# Patient Record
Sex: Female | Born: 1940 | Race: Black or African American | Hispanic: No | Marital: Single | Smoking: Never smoker
Health system: Southern US, Community
[De-identification: ages and names within clinical notes are randomized; demographics above are authoritative.]

## PROBLEM LIST (undated history)

## (undated) DIAGNOSIS — I4891 Unspecified atrial fibrillation: Secondary | ICD-10-CM

## (undated) DIAGNOSIS — F419 Anxiety disorder, unspecified: Secondary | ICD-10-CM

## (undated) DIAGNOSIS — I519 Heart disease, unspecified: Secondary | ICD-10-CM

## (undated) DIAGNOSIS — R42 Dizziness and giddiness: Secondary | ICD-10-CM

## (undated) DIAGNOSIS — E7800 Pure hypercholesterolemia, unspecified: Secondary | ICD-10-CM

## (undated) HISTORY — DX: Heart disease, unspecified: I51.9

## (undated) HISTORY — PX: BREAST SURGERY: SHX581

## (undated) HISTORY — DX: Anxiety disorder, unspecified: F41.9

## (undated) HISTORY — PX: TUBAL LIGATION: SHX77

## (undated) HISTORY — DX: Unspecified atrial fibrillation: I48.91

---

## 2013-06-29 ENCOUNTER — Emergency Department (HOSPITAL_COMMUNITY)
Admission: EM | Admit: 2013-06-29 | Discharge: 2013-06-29 | Disposition: A | Discharge status: Home / Self Care | Payer: Medicare Other | Attending: Emergency Medicine | Admitting: Emergency Medicine

## 2013-06-29 ENCOUNTER — Encounter (HOSPITAL_COMMUNITY): Payer: Self-pay | Admitting: Emergency Medicine

## 2013-06-29 DIAGNOSIS — R42 Dizziness and giddiness: Secondary | ICD-10-CM | POA: Insufficient documentation

## 2013-06-29 DIAGNOSIS — Z8639 Personal history of other endocrine, nutritional and metabolic disease: Secondary | ICD-10-CM | POA: Insufficient documentation

## 2013-06-29 DIAGNOSIS — Z862 Personal history of diseases of the blood and blood-forming organs and certain disorders involving the immune mechanism: Secondary | ICD-10-CM | POA: Insufficient documentation

## 2013-06-29 HISTORY — DX: Dizziness and giddiness: R42

## 2013-06-29 HISTORY — DX: Pure hypercholesterolemia, unspecified: E78.00

## 2013-06-29 LAB — BASIC METABOLIC PANEL
BUN: 16 mg/dL (ref 6–23)
CO2: 27 mEq/L (ref 19–32)
Calcium: 9.3 mg/dL (ref 8.4–10.5)
Chloride: 101 mEq/L (ref 96–112)
Creatinine, Ser: 0.67 mg/dL (ref 0.50–1.10)
GFR calc Af Amer: >90 mL/min (ref >90)
GFR, EST NON AFRICAN AMERICAN: 84 mL/min — AB (ref >90)
GLUCOSE: 120 mg/dL — AB (ref 70–99)
POTASSIUM: 4.1 meq/L (ref 3.7–5.3)
SODIUM: 138 meq/L (ref 137–147)

## 2013-06-29 LAB — CBC WITH DIFFERENTIAL/PLATELET
Basophils Absolute: 0 10*3/uL (ref 0.0–0.1)
Basophils Relative: 0 % (ref 0–1)
EOS ABS: 0 10*3/uL (ref 0.0–0.7)
Eosinophils Relative: 0 % (ref 0–5)
HCT: 38 % (ref 36.0–46.0)
Hemoglobin: 13 g/dL (ref 12.0–15.0)
Lymphocytes Relative: 9 % — ABNORMAL LOW (ref 12–46)
Lymphs Abs: 1 10*3/uL (ref 0.7–4.0)
MCH: 31.7 pg (ref 26.0–34.0)
MCHC: 34.2 g/dL (ref 30.0–36.0)
MCV: 92.7 fL (ref 78.0–100.0)
Monocytes Absolute: 0.4 10*3/uL (ref 0.1–1.0)
Monocytes Relative: 3 % (ref 3–12)
Neutro Abs: 10 10*3/uL — ABNORMAL HIGH (ref 1.7–7.7)
Neutrophils Relative %: 88 % — ABNORMAL HIGH (ref 43–77)
Platelets: 186 10*3/uL (ref 150–400)
RBC: 4.1 MIL/uL (ref 3.87–5.11)
RDW: 13.2 % (ref 11.5–15.5)
WBC: 11.4 10*3/uL — ABNORMAL HIGH (ref 4.0–10.5)

## 2013-06-29 LAB — TROPONIN I: Troponin I: <0.3 ng/mL (ref <0.30)

## 2013-06-29 MED ORDER — MECLIZINE HCL 25 MG PO TABS
25.0000 mg | ORAL_TABLET | Freq: Three times a day (TID) | ORAL | Status: AC | PRN
Start: 2013-06-29 — End: ?

## 2013-06-29 MED ORDER — ONDANSETRON HCL 4 MG/2ML IJ SOLN
INTRAMUSCULAR | Status: AC
  Filled 2013-06-29: qty 2

## 2013-06-29 MED ORDER — ONDANSETRON HCL 4 MG/2ML IJ SOLN
4.0000 mg | Freq: Once | INTRAMUSCULAR | Status: AC
  Administered 2013-06-29: 4 mg via INTRAVENOUS

## 2013-06-29 MED ORDER — MECLIZINE HCL 12.5 MG PO TABS
25.0000 mg | ORAL_TABLET | Freq: Once | ORAL | Status: AC
Start: 2013-06-29 — End: 2013-06-29
  Administered 2013-06-29: 25 mg via ORAL
  Filled 2013-06-29: qty 2

## 2013-06-29 NOTE — ED Notes (Signed)
Reports woke up this morning with severe vertigo with room spinning.  Took OTC motion sickness relief and started vomiting and has been unable to keep meds down.  Denies pain.  C/o numbness and tingling in bil hands and feet.  EMS gave zofran 4mg  IV in route with no relief.

## 2013-06-29 NOTE — Discharge Instructions (Signed)
Benign Positional Vertigo Vertigo means you feel like you or your surroundings are moving when they are not. Benign positional vertigo is the most common form of vertigo. Benign means that the cause of your condition is not serious. Benign positional vertigo is more common in older adults. CAUSES  Benign positional vertigo is the result of an upset in the labyrinth system. This is an area in the middle ear that helps control your balance. This may be caused by a viral infection, head injury, or repetitive motion. However, often no specific cause is found. SYMPTOMS  Symptoms of benign positional vertigo occur when you move your head or eyes in different directions. Some of the symptoms may include:  Loss of balance and falls.  Vomiting.  Blurred vision.  Dizziness.  Nausea.  Involuntary eye movements (nystagmus). DIAGNOSIS  Benign positional vertigo is usually diagnosed by physical exam. If the specific cause of your benign positional vertigo is unknown, your caregiver may perform imaging tests, such as magnetic resonance imaging (MRI) or computed tomography (CT). TREATMENT  Your caregiver may recommend movements or procedures to correct the benign positional vertigo. Medicines such as meclizine, benzodiazepines, and medicines for nausea may be used to treat your symptoms. In rare cases, if your symptoms are caused by certain conditions that affect the inner ear, you may need surgery. HOME CARE INSTRUCTIONS   Follow your caregiver's instructions.  Move slowly. Do not make sudden body or head movements.  Avoid driving.  Avoid operating heavy machinery.  Avoid performing any tasks that would be dangerous to you or others during a vertigo episode.  Drink enough fluids to keep your urine clear or pale yellow. SEEK IMMEDIATE MEDICAL CARE IF:   You develop problems with walking, weakness, numbness, or using your arms, hands, or legs.  You have difficulty speaking.  You develop  severe headaches.  Your nausea or vomiting continues or gets worse.  You develop visual changes.  Your family or friends notice any behavioral changes.  Your condition gets worse.  You have a fever.  You develop a stiff neck or sensitivity to light. MAKE SURE YOU:   Understand these instructions.  Will watch your condition.  Will get help right away if you are not doing well or get worse. Document Released: 11/29/2005 Document Revised: 05/16/2011 Document Reviewed: 11/11/2010 [facility] Patient Information 2014 [place], [place].   Rest. Prescription for vertigo.   Followup your Dr.

## 2013-06-29 NOTE — ED Provider Notes (Signed)
CSN: [id number]     Arrival date & time 06/29/13  1025 History  This chart was scribed for Donnetta HutchingBrian Tayjon Halladay, MD by Quintella ReichertMatthew Underwood, ED scribe.  This patient was seen in room APA01/APA01 and the patient's care was started at 11:56 AM.   Chief Complaint  Patient presents with  . Dizziness    The history is provided by the patient. No language interpreter was used.    HPI Comments: Michelle Ayala is a 73 y.o. female with h/o vertigo and high cholesterol who presents to the Emergency Department complaining of dizziness that began this morning.  Pt states she awoke at 8:30 AM and when she opened her eyes "the room started spinning."  She got to her kitchen and took one of her "motion pills" prescribed for previous vertigo.  She lay back in bed but continued to feel dizzy.  She became nauseated and went to the restroom and vomited.  She was unable to get back up to her bed so she called her daughter.  She states she also had some numbness and tingling in her fingertips.  She denies associated CP or SOB.  When daughter arrived she took another motion pill and had some Ginger Ale but vomited this up too, so she called EMS.  She received a Zofran injection after arrival to the ED and currently pt states she feels much better in spite of not being able to tolerate any vertigo medications.  She states the tingling in fingertips has since resolved.  Pt denies personal h/o DM, HTN or MI.  She admits to family h/o HTN and MI.   Past Medical History  Diagnosis Date  . Vertigo   . High cholesterol     Past Surgical History  Procedure Laterality Date  . Cesarean section    . Tubal ligation    . Breast surgery      cyst removal   No family history on file.   History  Substance Use Topics  . Smoking status: Never Smoker   . Smokeless tobacco: Not on file  . Alcohol Use: Yes     Comment: occ    OB History   Grav Para Term Preterm Abortions TAB SAB Ect Mult Living                   Review of  Systems A complete 10 system review of systems was obtained and all systems are negative except as noted in the HPI and PMH.     Allergies  Review of patient's allergies indicates no known allergies.  Home Medications   Prior to Admission medications   Not on File   BP 134/70  Pulse 83  Temp(Src) 97.6 F (36.4 C) (Oral)  Resp 24  Ht 5\' 3"  (1.6 m)  Wt 186 lb (84.369 kg)  BMI 32.96 kg/m2  SpO2 100%  Physical Exam  Nursing note and vitals reviewed. Constitutional: She is oriented to person, place, and time. She appears well-developed and well-nourished.  HENT:  Head: Normocephalic and atraumatic.  Eyes: Conjunctivae and EOM are normal. Pupils are equal, round, and reactive to light.  Neck: Normal range of motion. Neck supple.  Cardiovascular: Normal rate, regular rhythm and normal heart sounds.   Pulmonary/Chest: Effort normal and breath sounds normal.  Abdominal: Soft. Bowel sounds are normal.  Musculoskeletal: Normal range of motion.  Neurological: She is alert and oriented to person, place, and time.  Skin: Skin is warm and dry.  Psychiatric: She has a normal  mood and affect. Her behavior is normal.    ED Course  Procedures (including critical care time)  DIAGNOSTIC STUDIES: Oxygen Saturation is 100% on room air, normal by my interpretation.    COORDINATION OF CARE: 12:05 PM-Discussed treatment plan which includes bloodwork and dizziness medication with pt at bedside and pt agreed to plan.     Labs Review Labs Reviewed  CBC WITH DIFFERENTIAL - Abnormal; Notable for the following:    WBC 11.4 (*)    Neutrophils Relative % 88 (*)    Neutro Abs 10.0 (*)    Lymphocytes Relative 9 (*)    All other components within normal limits  BASIC METABOLIC PANEL - Abnormal; Notable for the following:    Glucose, Bld 120 (*)    GFR calc non Af Amer 84 (*)    All other components within normal limits  TROPONIN I    Imaging Review No results found.   EKG  Interpretation None      MDM   Final diagnoses:  Vertigo    Patient feels much better after by mouth meclizine. No neuro deficits. Discharge medications meclizine 25 mg    I personally performed the services described in this documentation, which was scribed in my presence. The recorded information has been reviewed and is accurate.    Donnetta HutchingBrian Doreatha Offer, MD 06/29/13 [phone]

## 2015-05-08 ENCOUNTER — Other Ambulatory Visit: Payer: Self-pay | Admitting: Neurological Surgery

## 2015-05-08 DIAGNOSIS — M48061 Spinal stenosis, lumbar region without neurogenic claudication: Secondary | ICD-10-CM

## 2015-05-16 ENCOUNTER — Ambulatory Visit
Admission: RE | Admit: 2015-05-16 | Discharge: 2015-05-16 | Disposition: A | Discharge status: Home / Self Care | Payer: PRIVATE HEALTH INSURANCE | Source: Ambulatory Visit | Attending: Neurological Surgery | Admitting: Neurological Surgery

## 2015-05-16 DIAGNOSIS — M48061 Spinal stenosis, lumbar region without neurogenic claudication: Secondary | ICD-10-CM

## 2017-02-01 IMAGING — MR MR LUMBAR SPINE W/O CM
4 of 5 series · 21 of 48 positions shown · non-contrast
Comparison: Lumbar spine radiographs 05/07/2015.

CLINICAL DATA: Centralized low back pain extending into the
anterior thigh bilaterally for 2 years. Numbness in both eyes. Leg
weakness with walking.

EXAM:
MRI LUMBAR SPINE WITHOUT CONTRAST
TECHNIQUE: Multiplanar, multisequence MR imaging of the lumbar spine was
performed. No intravenous contrast was administered.

[Series 6: T2 · sagittal · 4.0mm · 0.73mm/px · 6 of 15 slices shown (1 of 2)]
[im 1/15]
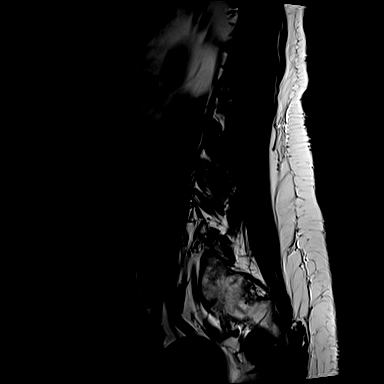
[im 3/15]
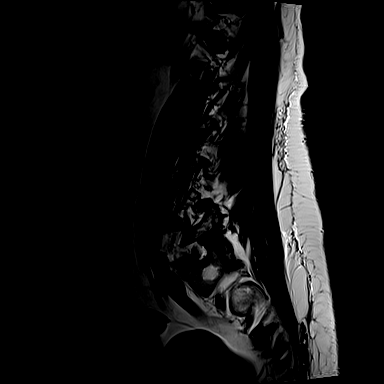
[im 6/15]
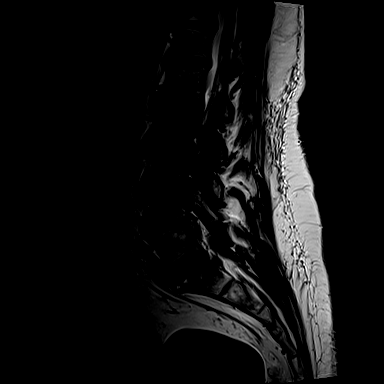
[im 9/15]
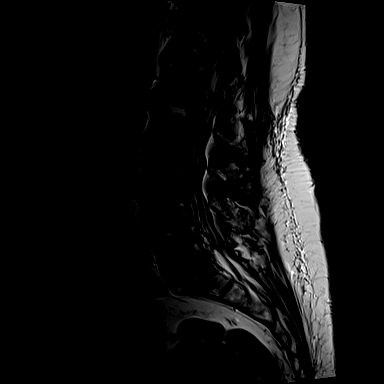
[im 12/15]
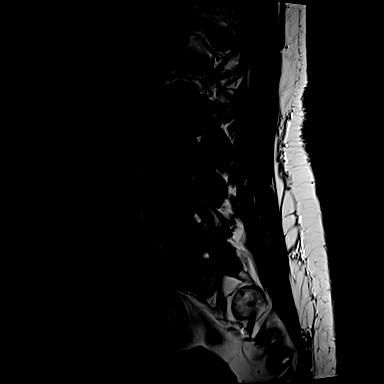
[im 15/15]
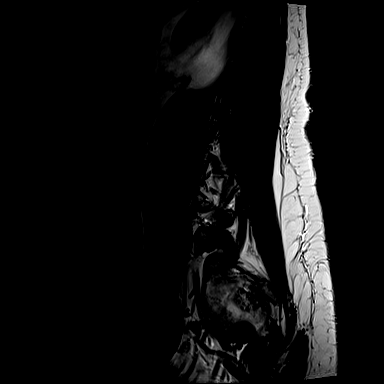

[Series 7: T1 · sagittal · 4.0mm · 0.73mm/px · 4 of 15 slices shown (1 of 2)]
[im 1/15]
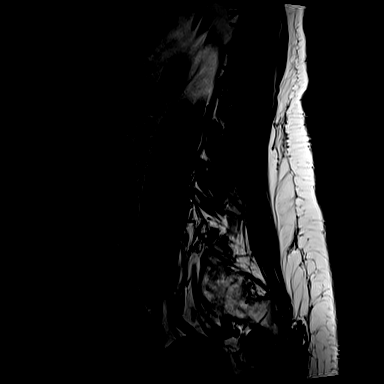
[im 3/15]
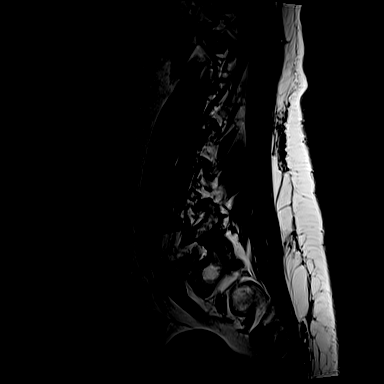
[im 8/15]
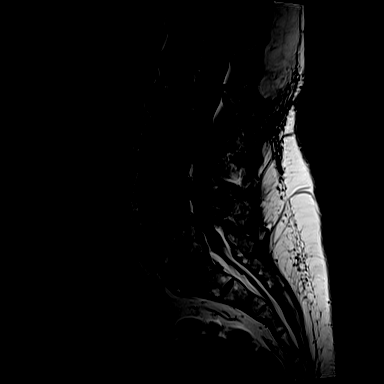
[im 12/15]
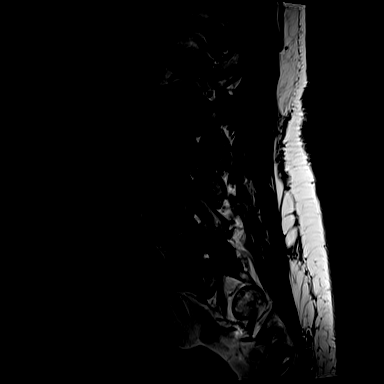

[Series 9: T1 · axial · 4.0mm · 0.56mm/px · z∈[-19,+102]mm · 3 of 30 slices shown (2 of 2)]
[im 5/30]
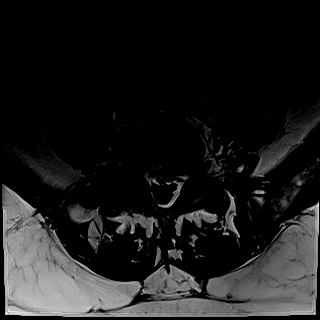
[im 16/30]
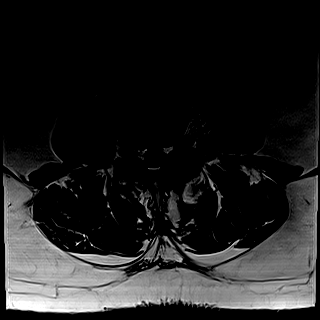
[im 25/30]
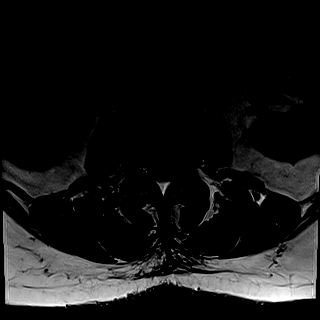

[Series 10: T2 · axial · 4.0mm · 0.28mm/px · z∈[-39,+127]mm · 8 of 30 slices shown (2 of 2)]
[im 1/30]
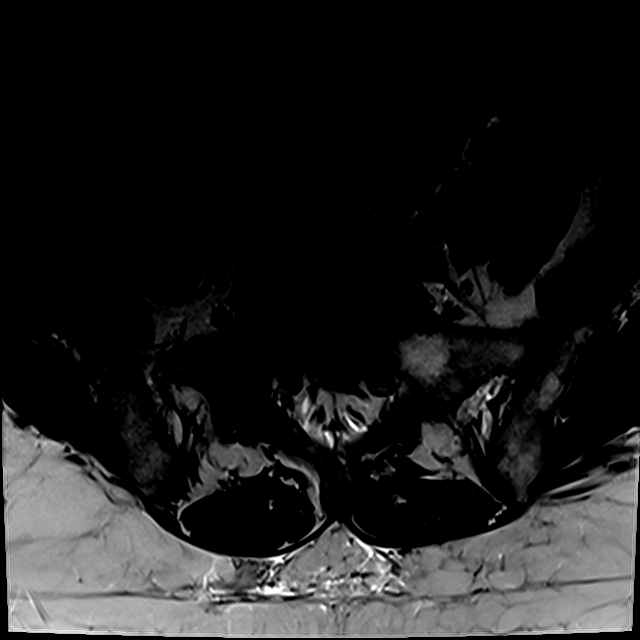
[im 5/30]
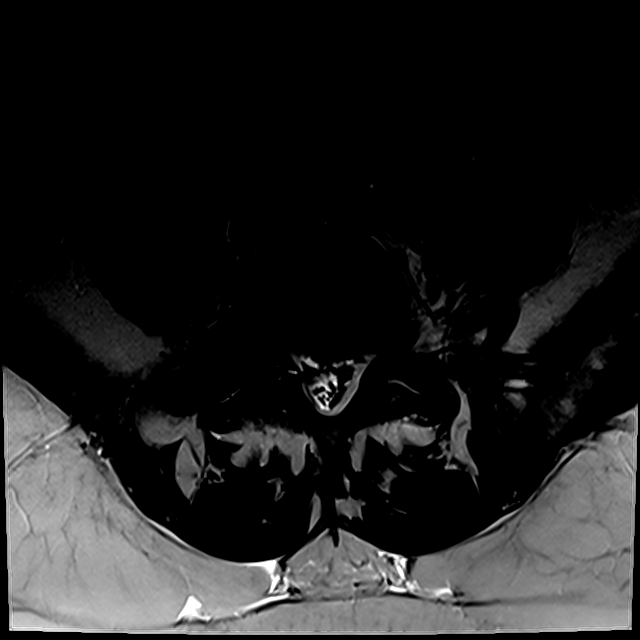
[im 9/30]
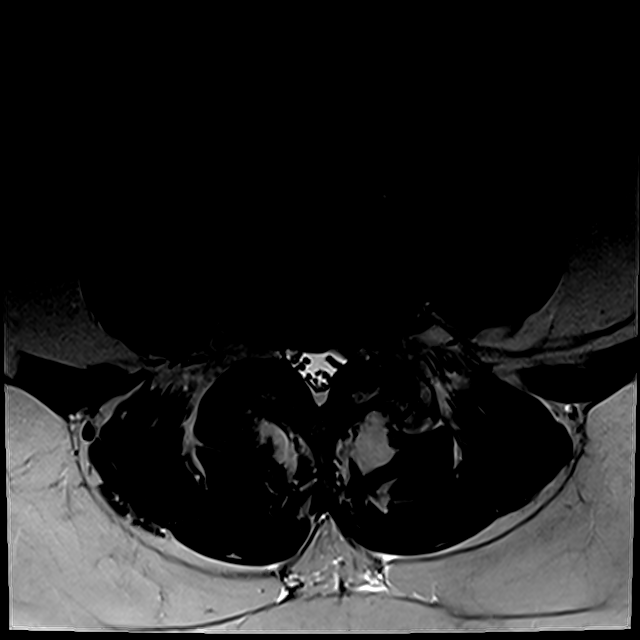
[im 14/30]
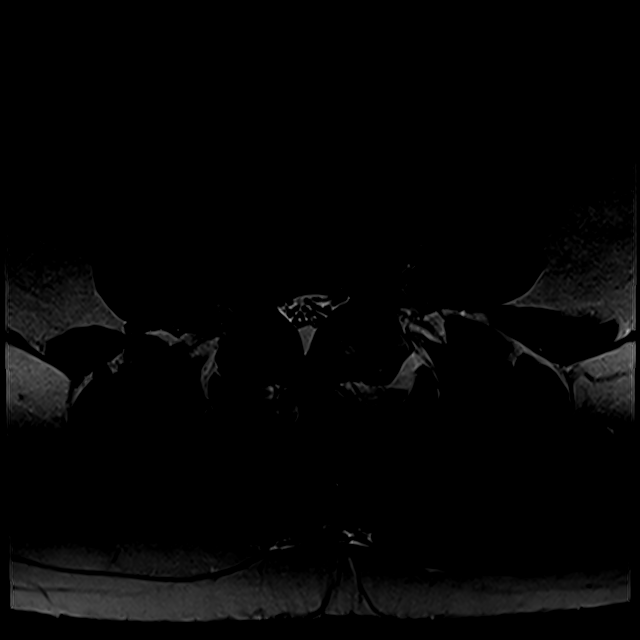
[im 16/30]
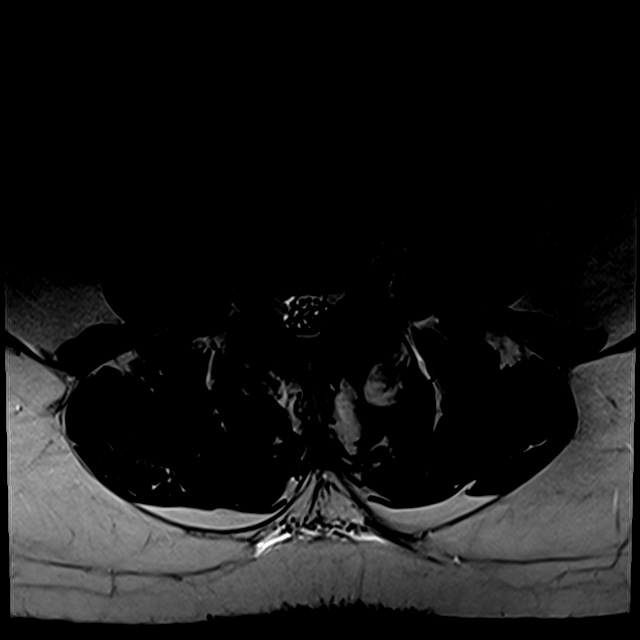
[im 21/30]
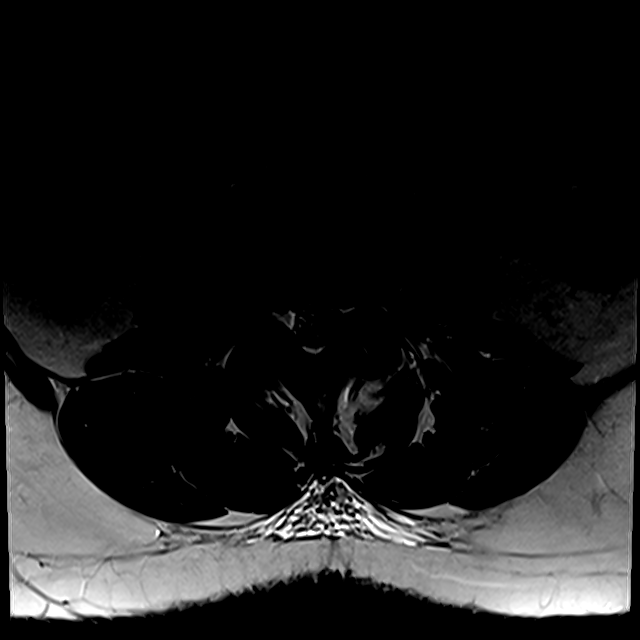
[im 25/30]
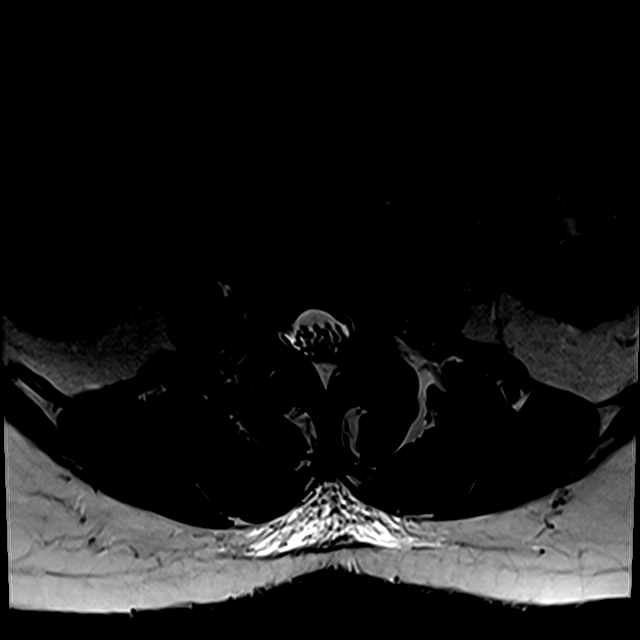
[im 30/30]
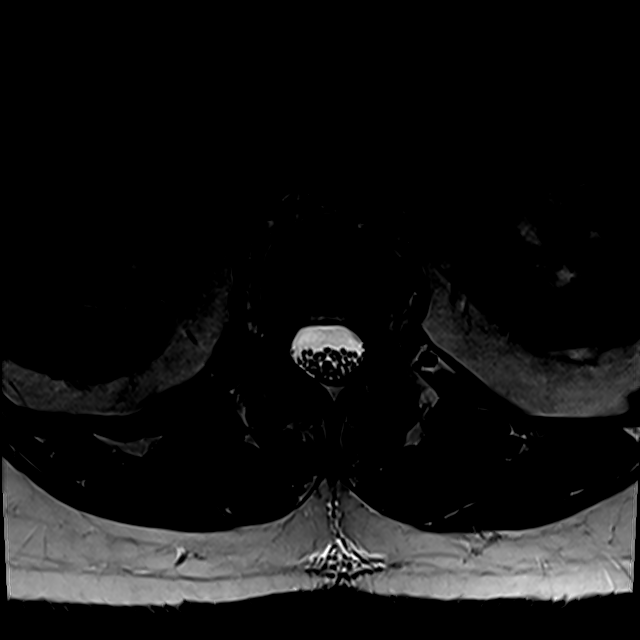

[21 of 48 positions shown; findings below may reference images not displayed]

FINDINGS: Normal signal is present in the conus medullaris which terminates at
T12-L1, within normal limits. Chronic endplate marrow changes are
present at L1-2, L3-4, and L4-5. There slight retrolisthesis at L3-4
and mild anterolisthesis at L4-5. Vertebral body heights and
alignment are otherwise maintained. Rightward curvature of the
lumbar spine is centered at L3-4.

Limited imaging of the abdomen is unremarkable.

L1-2: A broad-based disc protrusion is asymmetric to the right. Mild
facet hypertrophy is noted bilaterally. This results in mild
foraminal narrowing bilaterally, worse on the right.

L2-3: A broad-based disc protrusion is present. Moderate facet
hypertrophy is present bilaterally, left greater than right. Mild
subarticular and foraminal narrowing is present on the left.

L3-4: A broad-based disc protrusion is present. Moderate facet
hypertrophy is noted bilaterally. This results in mild subarticular
narrowing bilaterally. Moderate left and mild right foraminal
stenosis is present.

L4-5: A broad-based disc protrusion is present. Moderate facet
hypertrophy is noted bilaterally. Facet spurring contributes to mild
foraminal narrowing bilaterally.

L5-S1: Asymmetric right-sided facet hypertrophy is present.
Bilateral facet spurring contributes to mild foraminal narrowing,
right greater than left. There is minimal right subarticular
narrowing as well.
IMPRESSION: 1. Multilevel spondylosis of the lumbar spine with dextro convex
curvature centered at L3-4.
2. Mild foraminal narrowing bilaterally at L1-2 is worse on the
right.
3. Mild subarticular and foraminal stenosis on the left at L2-3.
4. Mild subarticular narrowing bilaterally with moderate left and
mild right foraminal stenosis at L3-4.
5. Mild foraminal narrowing bilaterally at L4-5.
6. Mild foraminal narrowing at L5-S1 is worse on the right.

## 2019-06-11 ENCOUNTER — Encounter (HOSPITAL_COMMUNITY): Payer: Self-pay | Admitting: Student

## 2019-06-11 ENCOUNTER — Emergency Department (HOSPITAL_COMMUNITY)
Admission: EM | Admit: 2019-06-11 | Discharge: 2019-06-11 | Disposition: A | Discharge status: Home / Self Care | Payer: Medicare PPO | Attending: Emergency Medicine | Admitting: Emergency Medicine

## 2019-06-11 ENCOUNTER — Other Ambulatory Visit: Payer: Self-pay

## 2019-06-11 ENCOUNTER — Emergency Department (HOSPITAL_COMMUNITY): Payer: Medicare PPO

## 2019-06-11 DIAGNOSIS — R000 Tachycardia, unspecified: Secondary | ICD-10-CM | POA: Diagnosis present

## 2019-06-11 DIAGNOSIS — R05 Cough: Secondary | ICD-10-CM | POA: Insufficient documentation

## 2019-06-11 DIAGNOSIS — I4891 Unspecified atrial fibrillation: Secondary | ICD-10-CM | POA: Diagnosis not present

## 2019-06-11 DIAGNOSIS — R062 Wheezing: Secondary | ICD-10-CM | POA: Diagnosis not present

## 2019-06-11 DIAGNOSIS — R2243 Localized swelling, mass and lump, lower limb, bilateral: Secondary | ICD-10-CM | POA: Diagnosis not present

## 2019-06-11 DIAGNOSIS — I509 Heart failure, unspecified: Secondary | ICD-10-CM

## 2019-06-11 DIAGNOSIS — I10 Essential (primary) hypertension: Secondary | ICD-10-CM | POA: Insufficient documentation

## 2019-06-11 DIAGNOSIS — Z7982 Long term (current) use of aspirin: Secondary | ICD-10-CM | POA: Insufficient documentation

## 2019-06-11 DIAGNOSIS — I484 Atypical atrial flutter: Secondary | ICD-10-CM

## 2019-06-11 DIAGNOSIS — Z79899 Other long term (current) drug therapy: Secondary | ICD-10-CM | POA: Insufficient documentation

## 2019-06-11 LAB — CBC
HCT: 37.6 % (ref 36.0–46.0)
Hemoglobin: 12.2 g/dL (ref 12.0–15.0)
MCH: 31.9 pg (ref 26.0–34.0)
MCHC: 32.4 g/dL (ref 30.0–36.0)
MCV: 98.2 fL (ref 80.0–100.0)
Platelets: 217 10*3/uL (ref 150–400)
RBC: 3.83 MIL/uL — ABNORMAL LOW (ref 3.87–5.11)
RDW: 13.3 % (ref 11.5–15.5)
WBC: 7.9 10*3/uL (ref 4.0–10.5)
nRBC: 0 % (ref 0.0–0.2)

## 2019-06-11 LAB — BASIC METABOLIC PANEL
Anion gap: 10 (ref 5–15)
BUN: 18 mg/dL (ref 8–23)
CO2: 24 mmol/L (ref 22–32)
Calcium: 9.4 mg/dL (ref 8.9–10.3)
Chloride: 110 mmol/L (ref 98–111)
Creatinine, Ser: 0.88 mg/dL (ref 0.44–1.00)
GFR calc Af Amer: >60 mL/min (ref >60)
GFR calc non Af Amer: >60 mL/min (ref >60)
Glucose, Bld: 129 mg/dL — ABNORMAL HIGH (ref 70–99)
Potassium: 4 mmol/L (ref 3.5–5.1)
Sodium: 144 mmol/L (ref 135–145)

## 2019-06-11 LAB — TROPONIN I (HIGH SENSITIVITY)
Troponin I (High Sensitivity): 10 ng/L (ref <18)
Troponin I (High Sensitivity): 9 ng/L (ref <18)

## 2019-06-11 LAB — BRAIN NATRIURETIC PEPTIDE: B Natriuretic Peptide: 259.4 pg/mL — ABNORMAL HIGH (ref 0.0–100.0)

## 2019-06-11 MED ORDER — FUROSEMIDE 10 MG/ML IJ SOLN
20.0000 mg | Freq: Once | INTRAMUSCULAR | Status: AC
  Administered 2019-06-11: 19:00:00 20 mg via INTRAVENOUS
  Filled 2019-06-11: qty 2

## 2019-06-11 MED ORDER — METOPROLOL TARTRATE 25 MG PO TABS
25.0000 mg | ORAL_TABLET | Freq: Once | ORAL | Status: AC
  Administered 2019-06-11: 19:00:00 25 mg via ORAL
  Filled 2019-06-11: qty 1

## 2019-06-11 MED ORDER — APIXABAN 5 MG PO TABS
5.0000 mg | ORAL_TABLET | Freq: Two times a day (BID) | ORAL | Status: DC
  Administered 2019-06-11: 5 mg via ORAL
  Filled 2019-06-11: qty 1

## 2019-06-11 MED ORDER — POTASSIUM CHLORIDE CRYS ER 20 MEQ PO TBCR
40.0000 meq | EXTENDED_RELEASE_TABLET | Freq: Once | ORAL | Status: AC
  Administered 2019-06-11: 19:00:00 40 meq via ORAL
  Filled 2019-06-11: qty 2

## 2019-06-11 MED ORDER — APIXABAN 5 MG PO TABS: 5.0000 mg | ORAL_TABLET | Freq: Two times a day (BID) | ORAL | 2 refills | Status: DC

## 2019-06-11 MED ORDER — DILTIAZEM HCL 25 MG/5ML IV SOLN
10.0000 mg | Freq: Once | INTRAVENOUS | Status: AC
  Administered 2019-06-11: 16:00:00 10 mg via INTRAVENOUS
  Filled 2019-06-11: qty 5

## 2019-06-11 MED ORDER — METOPROLOL TARTRATE 25 MG PO TABS: 25.0000 mg | ORAL_TABLET | Freq: Two times a day (BID) | ORAL | 2 refills | Status: DC

## 2019-06-11 MED ORDER — SODIUM CHLORIDE 0.9% FLUSH: 3.0000 mL | Freq: Once | INTRAVENOUS | Status: DC

## 2019-06-11 NOTE — ED Triage Notes (Signed)
Pt here from eye doctor's office-was supposed to have cataract surgery this morning but heart rate was 151 at office, so sent here for eval. Endorses shortness of breath x 1 week, denies cp.

## 2019-06-11 NOTE — ED Notes (Signed)
Discharge instructions reviewed with pt. Pt verbalized understanding.   

## 2019-06-11 NOTE — Consult Note (Signed)
Cardiology Consult:   Patient ID: Michelle Ayala MRN: [id number]; DOB: Jul 01, 1940   Admission date: 06/11/2019  Primary Care Provider: Patient, No Pcp Per Primary Cardiologist: No primary care provider on file.  Primary Electrophysiologist:  None   Chief Complaint:  Atrial Flutter  Patient Profile:   Michelle Ayala is a 79 y.o. female with PMH of HL, and vertigo who presented to the ED with new onset atrial flutter.   History of Present Illness:   Michelle Ayala is an 79 yo female with PMH noted above.  The patient went for elective cataract surgery this morning.  She was noted to be in atrial fibrillation/flutter with RVR and her surgery was canceled.  She was sent to the emergency department.  Cardiology consultation is requested after an EKG demonstrates atypical atrial flutter with RVR.  The patient complains of about 2 weeks of heart palpitations, shortness of breath, and weakness.  She has had episodes of wheezing in the morning and admits to some trouble sleeping.  She has not had any chest pain or pressure.  She has no past history of cardiac disease and denies any history of coronary artery disease, arrhythmia, or congestive heart failure.  She has had mild leg swelling.  She has not had lightheadedness or syncope.  Denies orthopnea or PND.   Past Medical History:  Diagnosis Date  . High cholesterol   . Vertigo     Past Surgical History:  Procedure Laterality Date  . BREAST SURGERY     cyst removal  . CESAREAN SECTION    . TUBAL LIGATION       Medications Prior to Admission: Prior to Admission medications   Medication Sig Start Date End Date Taking? Authorizing Provider  albuterol (PROVENTIL HFA;VENTOLIN HFA) 108 (90 BASE) MCG/ACT inhaler Inhale 1-2 puffs into the lungs every 6 (six) hours as needed for wheezing or shortness of breath.   Yes [provider]  aspirin EC 81 MG tablet Take 81 mg by mouth every 6 (six) hours as needed.    Yes [provider]    cetirizine (ZYRTEC) 5 MG tablet Take 5 mg by mouth daily.   Yes [provider]  Cholecalciferol (VITAMIN D3) 25 MCG (1000 UT) CAPS Take 1 capsule by mouth daily.   Yes [provider]  dimenhyDRINATE (DRAMAMINE) 50 MG tablet Take 100 mg by mouth every 8 (eight) hours as needed for nausea or dizziness.   Yes [provider]  DUREZOL 0.05 % EMUL Place 1 drop into the right eye 4 (four) times daily. 05/10/19  Yes [provider]  ezetimibe (ZETIA) 10 MG tablet Take 10 mg by mouth at bedtime.   Yes [provider]  fluticasone (FLONASE) 50 MCG/ACT nasal spray Place 1 spray into both nostrils daily as needed for allergies or rhinitis.   Yes [provider]  losartan (COZAAR) 25 MG tablet Take 25 mg by mouth daily.   Yes [provider]  meclizine (ANTIVERT) 25 MG tablet Take 1 tablet (25 mg total) by mouth 3 (three) times daily as needed for dizziness. 06/29/13  Yes Donnetta Hutching, MD  moxifloxacin (VIGAMOX) 0.5 % ophthalmic solution Place 1 drop into the right eye in the morning, at noon, in the evening, and at bedtime. 05/10/19  Yes [provider]  naproxen sodium (ALEVE) 220 MG tablet Take 220 mg by mouth daily as needed (For pain).   Yes [provider]  pseudoephedrine-dextromethorphan-guaifenesin (ROBITUSSIN-PE) 30-10-100 MG/5ML solution Take 10 mLs by mouth  4 (four) times daily as needed for cough.   Yes [provider]     Allergies:   No Known Allergies  Social History:   Social History   Socioeconomic History  . Marital status: Divorced    Spouse name: Not on file  . Number of children: Not on file  . Years of education: Not on file  . Highest education level: Not on file  Occupational History  . Not on file  Tobacco Use  . Smoking status: Never Smoker  Substance and Sexual Activity  . Alcohol use: Yes    Comment: occ  . Drug use: No  . Sexual activity: Not on file  Other Topics Concern  . Not  on file  Social History Narrative  . Not on file   Social Determinants of Health   Financial Resource Strain:   . Difficulty of Paying Living Expenses:   Food Insecurity:   . Worried About Programme researcher, broadcasting/film/video in the Last Year:   . Barista in the Last Year:   Transportation Needs:   . Freight forwarder (Medical):   Marland Kitchen Lack of Transportation (Non-Medical):   Physical Activity:   . Days of Exercise per Week:   . Minutes of Exercise per Session:   Stress:   . Feeling of Stress :   Social Connections:   . Frequency of Communication with Friends and Family:   . Frequency of Social Gatherings with Friends and Family:   . Attends Religious Services:   . Active Member of Clubs or Organizations:   . Attends Banker Meetings:   Marland Kitchen Marital Status:   Intimate Partner Violence:   . Fear of Current or Ex-Partner:   . Emotionally Abused:   Marland Kitchen Physically Abused:   . Sexually Abused:     Family History:  The patient's family history is not on file.  There is no history of premature coronary artery disease in the family.  ROS:  Please see the history of present illness.  All other ROS reviewed and negative.     Physical Exam/Data:   Vitals:   06/11/19 1600 06/11/19 1615 06/11/19 1645 06/11/19 1700  BP: (!) 135/97 (!) 126/97 (!) 135/93 135/89  Pulse: (!) 56 83 83 74  Resp: 16 14 15 15   Temp:      TempSrc:      SpO2: 98% 98% 98% 97%   No intake or output data in the 24 hours ending 06/11/19 1736 Last 3 Weights 06/29/2013  Weight (lbs) 186 lb  Weight (kg) 84.369 kg     There is no height or weight on file to calculate BMI.  General:  Well nourished, well developed, in no acute distress HEENT: normal Lymph: no adenopathy Neck: no JVD Endocrine:  No thryomegaly Vascular: No carotid bruits; FA pulses 2+ bilaterally without bruits  Cardiac: Irregularly irregular, tachycardic; no murmur  Lungs:  clear to auscultation bilaterally, no wheezing, rhonchi or rales    Abd: soft, nontender, no hepatomegaly  Ext: Trace bilateral pretibial edema Musculoskeletal:  No deformities, BUE and BLE strength normal and equal Skin: warm and dry  Neuro:  CNs 2-12 intact, no focal abnormalities noted Psych:  Normal affect    EKG:  The ECG that was done  was personally reviewed and demonstrates atypical atrial flutter with RVR, nonspecific ST abnormality, incomplete right bundle branch block  Relevant CV Studies: None  Laboratory Data:  High Sensitivity Troponin:   Recent Labs  Lab  06/11/19 1056 06/11/19 1553  TROPONINIHS 10 9      Chemistry Recent Labs  Lab 06/11/19 1056  NA 144  K 4.0  CL 110  CO2 24  GLUCOSE 129*  BUN 18  CREATININE 0.88  CALCIUM 9.4  GFRNONAA >60  GFRAA >60  ANIONGAP 10    No results for input(s): PROT, ALBUMIN, AST, ALT, ALKPHOS, BILITOT in the last 168 hours. Hematology Recent Labs  Lab 06/11/19 1056  WBC 7.9  RBC 3.83*  HGB 12.2  HCT 37.6  MCV 98.2  MCH 31.9  MCHC 32.4  RDW 13.3  PLT 217   BNP Recent Labs  Lab 06/11/19 1547  BNP 259.4*    DDimer No results for input(s): DDIMER in the last 168 hours.   Radiology/Studies:  DG Chest 2 View  Result Date: 06/11/2019 CLINICAL DATA:  Palpitations.  Shortness of breath. EXAM: CHEST - 2 VIEW COMPARISON:  No prior. FINDINGS: Mediastinum hilar structures normal. Heart size normal. Lung volumes. Bilateral interstitial prominence consistent with pneumonitis and or interstitial edema. No pleural effusion or pneumothorax. Degenerative change thoracic spine. IMPRESSION: Low lung volumes with bilateral interstitial prominence consistent with pneumonitis and or interstitial edema. Electronically Signed   By: Maisie Fus  Register   On: 06/11/2019 11:31     Assessment and Plan:   79 year old woman with: 1.  Atypical atrial flutter with RVR: Discussed implications with the patient, including importance of rate and rhythm control.  We reviewed increased stroke risk.  Her  CHA2DS2-VASc score is 4 for female gender, age greater than 85, and hypertension.  Oral anticoagulation is indicated.  I recommended hospital admission for initiation of heart rate control, diuresis, and oral anticoagulation.  The patient is adamant about going home and she will not stay in the hospital.  I do think it is medically reasonable to manage her as an outpatient.  I had a specific discussion with the patient this evening about the need to seek immediate medical attention if she develops progressive shortness of breath, chest pain, lightheadedness, presyncope, or any other acute changes in her symptoms.  Otherwise we will try to treat her as an outpatient.  Will add apixaban 5 mg twice daily for oral anticoagulation.  This will be started immediately.  We will treat her with metoprolol 50 mg twice daily with her first dose now.  We will arrange close outpatient follow-up in the atrial fibrillation clinic.  She prefers to follow-up in [place] long-term, but I think for close follow-up, we should be able to see her this week in the A. fib clinic and can potentially arrange cardioversion if needed.  The patient understands and is willing to do this.  She could be considered for elective cardioversion after 3 weeks of therapeutic anticoagulation.  We will also order an echocardiogram to assess LV function, presence of any valvular disease, and atrial size which may have implications for rhythm versus rate control strategy.  2.  Acute heart failure, likely diastolic.  The patient has symptoms of progressive dyspnea and wheezing which is likely cardiac-related.  Her chest x-ray shows interstitial edema.  She has received 1 dose of IV furosemide.  She is comfortable with good oxygen saturation on room air.  I do not think we need to discharge her on oral diuretics and I am hopeful she will improve with better heart rate control and ultimately restoration of sinus rhythm.  3.  Hypertension, uncontrolled:  Continue losartan.  Add metoprolol 50 mg twice daily.  The patient  is seen in conjunction with Reino Bellis, NP.  They findings above, including the history of present illness, physical exam, and assessment and plan all reflects my personal findings and are my personal documentation.  For questions or updates, please contact [place] Please consult [email or web address] for contact info under     Signed, Reino Bellis, NP  06/11/2019 5:36 PM   Sherren Mocha 06/11/2019 6:37 PM

## 2019-06-11 NOTE — Progress Notes (Signed)
ANTICOAGULATION CONSULT NOTE - Initial Consult  Pharmacy Consult for Eliquis Indication: atrial fibrillation  No Known Allergies  Patient Measurements: Weight: 81.2 kg (179 lb)  Vital Signs: Temp: 98.6 F (37 C) (04/06 1039) Temp Source: Oral (04/06 1039) BP: 137/84 (04/06 1745) Pulse Rate: 83 (04/06 1745)  Labs: Recent Labs    06/11/19 1056 06/11/19 1553  HGB 12.2  --   HCT 37.6  --   PLT 217  --   CREATININE 0.88  --   TROPONINIHS 10 9    CrCl cannot be calculated (Unknown ideal weight.).   Medical History: Past Medical History:  Diagnosis Date  . High cholesterol   . Vertigo     Assessment: 79 yo F presents with new onset of atrial fibrillation. CHADS VASc 3. Pharmacy consulted to dose Eliquis. Scr > 1.5, Wt > 60 kg. CBC wnl.   Goal of Therapy:  Monitor platelets by anticoagulation protocol: Yes   Plan:  - Start Eliquis 5mg  BID - Monitor for s/sx bleeding - Eliquis education complete  , PharmD PGY1 Pharmacy Resident Phone: [phone] 06/11/2019  6:23 PM  Please check [email or web address] for unit-specific pharmacy phone numbers.

## 2019-06-11 NOTE — ED Provider Notes (Signed)
[facility] EMERGENCY DEPARTMENT Provider Note   CSN: [id number] Arrival date & time: 06/11/19  1037     History Chief Complaint  Patient presents with  . Tachycardia  . Shortness of Breath    Michelle Ayala is a 79 y.o. female with a history of hypertension & hypercholesterolemia who presents to the ED for evaluation of tachycardia which was noted pre-operatively this AM prior to cataract surgery. In terms of sxs patient states that she has had about 2 weeks of intermittent fatigue & shortness of breath, noted more when she is lying flat (has had to increase her pillows @ night) and with activities. She has also noted dry cough at night and in the AM, also has had some wheezing in the mornings which is alleviated with her albuterol inhaler. Over the past 3-4 nights she has noted some intermittent palpitations described as her heart racing. She thought palpitations may have been anxiety related regarding her surgery scheduled for this AM. She went to have her cataract surgery performed and was found to be tachycardic in afib and was told she needed to be evaluated by a cardiologist, ultimately came to the ED. She denies history of prior afib. On further questioning she does note she intermittently has bilateral lower extremity swelling when she eats too much salt. Denies fever, chills, chest pain, acute leg pain, hemoptysis, recent surgery/trauma, recent long travel, hormone use, personal hx of cancer, or hx of DVT/PE. Denies personal hx of CAD, does have family history of this.   HPI     Past Medical History:  Diagnosis Date  . High cholesterol   . Vertigo     There are no problems to display for this patient.   Past Surgical History:  Procedure Laterality Date  . BREAST SURGERY     cyst removal  . CESAREAN SECTION    . TUBAL LIGATION       OB History   No obstetric history on file.     History reviewed. No pertinent family history.  Social History    Tobacco Use  . Smoking status: Never Smoker  Substance Use Topics  . Alcohol use: Yes    Comment: occ  . Drug use: No    Home Medications Prior to Admission medications   Medication Sig Start Date End Date Taking? Authorizing Provider  albuterol (PROVENTIL HFA;VENTOLIN HFA) 108 (90 BASE) MCG/ACT inhaler Inhale 1-2 puffs into the lungs every 6 (six) hours as needed for wheezing or shortness of breath.    [provider]  aspirin EC 81 MG tablet Take 81 mg by mouth daily.    [provider]  dimenhyDRINATE (DRAMAMINE) 50 MG tablet Take 100 mg by mouth every 8 (eight) hours as needed for nausea or dizziness.    [provider]  ezetimibe-simvastatin (VYTORIN) 10-40 MG per tablet Take 1 tablet by mouth daily.    [provider]  gabapentin (NEURONTIN) 300 MG capsule Take 300 mg by mouth 5 (five) times daily.    [provider]  meclizine (ANTIVERT) 25 MG tablet Take 1 tablet (25 mg total) by mouth 3 (three) times daily as needed for dizziness. 06/29/13   Nat Christen, MD    Allergies    Patient has no known allergies.  Review of Systems   Review of Systems  Constitutional: Positive for fatigue. Negative for chills and fever.  HENT: Negative for sore throat.   Respiratory: Positive for cough, shortness of breath and wheezing.  Cardiovascular: Positive for palpitations and leg swelling. Negative for chest pain.  Gastrointestinal: Negative for abdominal pain, diarrhea, nausea and vomiting.  Genitourinary: Negative for dysuria.  Neurological: Negative for dizziness, syncope, weakness and light-headedness.  All other systems reviewed and are negative.  Physical Exam Updated Vital Signs BP 119/90 (BP Location: Right Arm)   Pulse (!) 106   Temp 98.6 F (37 C) (Oral)   Resp 20   SpO2 95%   Physical Exam Vitals and nursing note reviewed.  Constitutional:      General: She is not in acute distress.    Appearance: She is well-developed.  She is not toxic-appearing.  HENT:     Head: Normocephalic and atraumatic.  Eyes:     General:        Right eye: No discharge.        Left eye: No discharge.     Conjunctiva/sclera: Conjunctivae normal.  Cardiovascular:     Rate and Rhythm: Tachycardia present. Rhythm irregularly irregular.  Pulmonary:     Effort: Pulmonary effort is normal. No respiratory distress.     Breath sounds: Normal breath sounds. No wheezing, rhonchi or rales.  Abdominal:     General: There is no distension.     Palpations: Abdomen is soft.     Tenderness: There is no abdominal tenderness.  Musculoskeletal:     Cervical back: Neck supple.     Comments: Trace to 1+ symmetric pitting edema to the bilateral lower extremities. No overlying erythema/warmth.   Skin:    General: Skin is warm and dry.     Findings: No rash.  Neurological:     Mental Status: She is alert.     Comments: Clear speech.   Psychiatric:        Behavior: Behavior normal.     ED Results / Procedures / Treatments   Labs (all labs ordered are listed, but only abnormal results are displayed) Labs Reviewed  BASIC METABOLIC PANEL - Abnormal; Notable for the following components:      Result Value   Glucose, Bld 129 (*)    All other components within normal limits  CBC - Abnormal; Notable for the following components:   RBC 3.83 (*)    All other components within normal limits  TROPONIN I (HIGH SENSITIVITY)  TROPONIN I (HIGH SENSITIVITY)    EKG EKG Interpretation  Date/Time:  Tuesday June 11 2019 10:41:54 EDT Ventricular Rate:  133 PR Interval:    QRS Duration: 94 QT Interval:  302 QTC Calculation: 449 R Axis:   123 Text Interpretation: Atrial flutter with variable A-V block with premature ventricular or aberrantly conducted complexes Right axis deviation Incomplete right bundle branch block Possible Right ventricular hypertrophy Nonspecific ST and T wave abnormality Abnormal ECG Confirmed by Gerhard Munch [phone]) on  06/11/2019 3:47:53 PM   Radiology DG Chest 2 View  Result Date: 06/11/2019 CLINICAL DATA:  Palpitations.  Shortness of breath. EXAM: CHEST - 2 VIEW COMPARISON:  No prior. FINDINGS: Mediastinum hilar structures normal. Heart size normal. Lung volumes. Bilateral interstitial prominence consistent with pneumonitis and or interstitial edema. No pleural effusion or pneumothorax. Degenerative change thoracic spine. IMPRESSION: Low lung volumes with bilateral interstitial prominence consistent with pneumonitis and or interstitial edema. Electronically Signed   By: Maisie Fus  Register   On: 06/11/2019 11:31    Procedures .Critical Care Performed by: Cherly Anderson, PA-C Authorized by: Cherly Anderson, PA-C    CRITICAL CARE Performed by: Harvie Heck   Total critical care  time: 30 minutes  Critical care time was exclusive of separately billable procedures and treating other patients.  Critical care was necessary to treat or prevent imminent or life-threatening deterioration.  Critical care was time spent personally by me on the following activities: development of treatment plan with patient and/or surrogate as well as nursing, discussions with consultants, evaluation of patient's response to treatment, examination of patient, obtaining history from patient or surrogate, ordering and performing treatments and interventions, ordering and review of laboratory studies, ordering and review of radiographic studies, pulse oximetry and re-evaluation of patient's condition.   (including critical care time)  Medications Ordered in ED Medications  sodium chloride flush (NS) 0.9 % injection 3 mL (has no administration in time range)    ED Course  I have reviewed the triage vital signs and the nursing notes.  Pertinent labs & imaging results that were available during my care of the patient were reviewed by me and considered in my medical decision making (see chart for details).     Michelle Ayala was evaluated in Emergency Department on 06/11/2019 for the symptoms described in the history of present illness. He/she was evaluated in the context of the global COVID-19 pandemic, which necessitated consideration that the patient might be at risk for infection with the SARS-CoV-2 virus that causes COVID-19. Institutional protocols and algorithms that pertain to the evaluation of patients at risk for COVID-19 are in a state of rapid change based on information released by regulatory bodies including the CDC and federal and state organizations. These policies and algorithms were followed during the patient's care in the ED.  MDM Rules/Calculators/A&P                      Patient presents to the ED from ophthalmology office for evaluation of what appears to be new onset afib, sxs have included intermittent dyspnea, cough, orthopnea, and palpitations. EKG with afib with RVR. >48 hours from onset and hemodynamically stable appearing therefore do not feel emergent cardioversion is necessary. Additional ddx: new onset CHF, pneumonia, asthma exacerbation, anemia, thyroid dysfunction, electrolyte derangement, PE, ACS.   Additional history obtained:  Additional history obtained from patient's daughter- states patient does get short winded when walking around at times, this is not necessarily new over past 2 weeks. Previous records obtained and reviewed.   CHA2DS2-VASc Score = 4   Lab Tests:  I reviewed, and interpreted labs, which included:  CBC: No significant anemia or leukocytosis BMP: Mild hyperglycemia, no significant electrolyte derangement.  Initial troponin: 10  Added on BNP, second troponin pending on initial evaluation. Troponin: 9, flat  BNP: elevated @ 259   Imaging Studies ordered:  Imaging studies which included CXR were obtained, I independently visualized and interpreted imaging which showed Low lung volumes with bilateral interstitial prominence consistent with pneumonitis  and or interstitial edema  Medicines ordered:  I ordered medication of 10 mg of IV cardizem for afib w/ RVR.     Reevaluation/consultations: 17:00: After interventions above HR improved to 90-108 on my re-assessment, we discussed options of plan of care, patient and her daughter would really like for her to go home. Will discuss with cardiology on call.   Overall concern for degree of HF and new onset AFIB.   17:30: CONSULT: Discussed with cardiologist Dr. Eden Emms- cardiology team will come see patient in the ED.   18:05: Re-discussed with cardiology team- NP Laverda Page as well as Dr. Excell Seltzer, recommended admission, patient adamantly refused, will trial  outpatient therapy . NP Su Hilt sent in prescriptions for metoprolol & eliquis, will facilitate close follow-up with the A. fib clinic, in agreement with one-time dose of IV Lasix in the emergency department.  Appreciate consultation.   Pharmacy consult placed for medication education/counseling regarding Eliquis.   On re-assessment prior to discharge HR noted to be 120s-130s- again reiterated recommendation for admission, risks/benefits discussed including risk of death and permanent disability, patient & her daughter continue to wish to go home. Will give oral dose of metoprolol in the ED per discussion with Dr. Jeraldine Loots and NP Su Hilt from cardiology. Re-iterated very strict return precautions with patient & her daughter at bedside. I discussed results, treatment plan, need for follow-up, and return precautions with the patient. Provided opportunity for questions, patient confirmed understanding and is in agreement with plan.   Portions of this note were generated with Scientist, clinical (histocompatibility and immunogenetics). Dictation errors may occur despite best attempts at proofreading.   This is a shared visit with supervising physician Dr. Jeraldine Loots who has independently evaluated patient & provided guidance in evaluation/management/disposition, in agreement with  care   Final Clinical Impression(s) / ED Diagnoses Final diagnoses:  New onset atrial fibrillation [facility])    Rx / DC Orders ED Discharge Orders         Ordered    apixaban (ELIQUIS) 5 MG TABS tablet  2 times daily     06/11/19 1823    metoprolol tartrate (LOPRESSOR) 25 MG tablet  2 times daily     06/11/19 [address], PA-C 06/11/19 1918    Gerhard Munch, MD 06/11/19 2356

## 2019-06-11 NOTE — Discharge Instructions (Addendum)
You were seen in the emergency department today due to an elevated heart rate, it appears that you are in a rhythm known as atrial fibrillation.  We are also concerned that you may have a new diagnosis of heart failure based on your labs and chest x-ray.  We recommended that you stay in the hospital overnight, but given that you would like to go home we are sending you home with the following medications:   -Metoprolol: Please take this once in the morning and once in the evening, this is to help with heart rate control -Eliquis: Please take this once in the morning once in the evening, this is a blood thinner.   Please see attached guidelines regarding blood thinner use. With taking a blood thinner if you start to notice blood in your urine, blood in your stool, uncontrollable bleeding from any site, or you hit your head, return to the emergency department immediately.  We have prescribed you new medication(s) today. Discuss the medications prescribed today with your pharmacist as they can have adverse effects and interactions with your other medicines including over the counter and prescribed medications. Seek medical evaluation if you start to experience new or abnormal symptoms after taking one of these medicines, seek care immediately if you start to experience difficulty breathing, feeling of your throat closing, facial swelling, or rash as these could be indications of a more serious allergic reaction  Our cardiology team is working to schedule you an outpatient appointment at our atrial fibrillation clinic.  We have provided their information in your discharge instructions.  We would like you to follow-up within the next 1 to 2 days.  You may return to the emergency department at any time for reevaluation, return immediately for progressive shortness of breath, chest pain, lightheadedness, feeling as if he may pass out, passing out, or any new or worsening symptoms or any other concerns.

## 2019-06-12 ENCOUNTER — Telehealth (HOSPITAL_COMMUNITY): Payer: Self-pay | Admitting: Physician Assistant

## 2019-06-12 NOTE — Telephone Encounter (Signed)
Called and left message for patient to call back.  Need to schedule f/u appt from ED on 06/11/19.  Will try to get pt scheduled this Friday 06/14/19.

## 2019-06-12 NOTE — Telephone Encounter (Signed)
Patient returned my call.  She is aware of appt 06/14/19 @1 :45 with , PA.

## 2019-06-13 ENCOUNTER — Other Ambulatory Visit: Payer: Self-pay

## 2019-06-13 ENCOUNTER — Encounter (HOSPITAL_COMMUNITY): Payer: Self-pay | Admitting: Physician Assistant

## 2019-06-13 ENCOUNTER — Ambulatory Visit (HOSPITAL_COMMUNITY)
Admission: RE | Admit: 2019-06-13 | Discharge: 2019-06-13 | Disposition: A | Discharge status: Home / Self Care | Payer: Medicare PPO | Source: Ambulatory Visit | Attending: Physician Assistant | Admitting: Physician Assistant

## 2019-06-13 VITALS — BP 120/70 | HR 128 | Ht 63.0 in | Wt 189.4 lb

## 2019-06-13 DIAGNOSIS — E785 Hyperlipidemia, unspecified: Secondary | ICD-10-CM | POA: Insufficient documentation

## 2019-06-13 DIAGNOSIS — Z6833 Body mass index (BMI) 33.0-33.9, adult: Secondary | ICD-10-CM | POA: Diagnosis not present

## 2019-06-13 DIAGNOSIS — I4892 Unspecified atrial flutter: Secondary | ICD-10-CM | POA: Diagnosis present

## 2019-06-13 DIAGNOSIS — D6869 Other thrombophilia: Secondary | ICD-10-CM | POA: Diagnosis not present

## 2019-06-13 DIAGNOSIS — E669 Obesity, unspecified: Secondary | ICD-10-CM | POA: Insufficient documentation

## 2019-06-13 DIAGNOSIS — I484 Atypical atrial flutter: Secondary | ICD-10-CM | POA: Diagnosis not present

## 2019-06-13 DIAGNOSIS — I10 Essential (primary) hypertension: Secondary | ICD-10-CM | POA: Insufficient documentation

## 2019-06-13 DIAGNOSIS — E7800 Pure hypercholesterolemia, unspecified: Secondary | ICD-10-CM | POA: Insufficient documentation

## 2019-06-13 DIAGNOSIS — Z7901 Long term (current) use of anticoagulants: Secondary | ICD-10-CM | POA: Diagnosis not present

## 2019-06-13 DIAGNOSIS — Z79899 Other long term (current) drug therapy: Secondary | ICD-10-CM | POA: Diagnosis not present

## 2019-06-13 MED ORDER — METOPROLOL TARTRATE 25 MG PO TABS: 50.0000 mg | ORAL_TABLET | Freq: Two times a day (BID) | ORAL | 2 refills | Status: DC

## 2019-06-13 NOTE — Progress Notes (Signed)
Primary Care Physician: Patient, No Pcp Per Primary Cardiologist: Dr Excell Seltzer Primary Electrophysiologist: none Referring Physician: Dr Wilfred Curtis is a 79 y.o. female with a history of HTN, HLD, and atypical atrial flutter who presents for consultation in the [facility]. The patient was initially diagnosed with atrial flutter on 06/11/19. She was found to be tachycardic when she presented for her cataract surgery and eventually came to the ED. In hindsight, she had symptoms of intermittent fatigue and SOB for the previous two weeks. Patient was started on Eliquis for a CHADS2VASC score of 4 and metoprolol for rate control. Admission was advised but patient refused. Patient remains in rapid atrial flutter today with symptoms of fatigue and SOB with exertion. Her wheezing resolved with IV lasix at the ER. She denies missed doses of anticoagulation. She denies significant snoring or alcohol use.  Today, she denies symptoms of palpitations, chest pain, orthopnea, PND, lower extremity edema, dizziness, presyncope, syncope, snoring, daytime somnolence, bleeding, or neurologic sequela. The patient is tolerating medications without difficulties and is otherwise without complaint today.    Atrial Fibrillation Risk Factors:  she does not have symptoms or diagnosis of sleep apnea. she does not have a history of rheumatic fever. she does not have a history of alcohol use.   she has a BMI of Body mass index is 33.55 kg/m.Marland Kitchen Filed Weights   06/13/19 1035  Weight: 85.9 kg    No family history on file.   Atrial Fibrillation Management history:  Previous antiarrhythmic drugs: none Previous cardioversions: none Previous ablations: none CHADS2VASC score: 4 Anticoagulation history: Eliquis   Past Medical History:  Diagnosis Date  . High cholesterol   . Vertigo    Past Surgical History:  Procedure Laterality Date  . BREAST SURGERY     cyst removal  .  CESAREAN SECTION    . TUBAL LIGATION      Current Outpatient Medications  Medication Sig Dispense Refill  . albuterol (PROVENTIL HFA;VENTOLIN HFA) 108 (90 BASE) MCG/ACT inhaler Inhale 1-2 puffs into the lungs every 6 (six) hours as needed for wheezing or shortness of breath.    Marland Kitchen apixaban (ELIQUIS) 5 MG TABS tablet Take 1 tablet (5 mg total) by mouth 2 (two) times daily. 60 tablet 2  . cetirizine (ZYRTEC) 5 MG tablet Take 5 mg by mouth daily.    . Cholecalciferol (VITAMIN D3) 25 MCG (1000 UT) CAPS Take 1 capsule by mouth daily.    Marland Kitchen dimenhyDRINATE (DRAMAMINE) 50 MG tablet Take 100 mg by mouth every 8 (eight) hours as needed for nausea or dizziness.    . ezetimibe (ZETIA) 10 MG tablet Take 10 mg by mouth at bedtime.    . fluticasone (FLONASE) 50 MCG/ACT nasal spray Place 1 spray into both nostrils daily as needed for allergies or rhinitis.    Marland Kitchen losartan (COZAAR) 25 MG tablet Take 25 mg by mouth daily.    . meclizine (ANTIVERT) 25 MG tablet Take 1 tablet (25 mg total) by mouth 3 (three) times daily as needed for dizziness. 20 tablet 0  . metoprolol tartrate (LOPRESSOR) 25 MG tablet Take 2 tablets (50 mg total) by mouth 2 (two) times daily. 60 tablet 2  . naproxen sodium (ALEVE) 220 MG tablet Take 220 mg by mouth daily as needed (For pain).     No current facility-administered medications for this encounter.    Allergies  Allergen Reactions  . Cinnamon Anaphylaxis  . Shellfish Allergy Anaphylaxis  Social History   Socioeconomic History  . Marital status: Divorced    Spouse name: Not on file  . Number of children: Not on file  . Years of education: Not on file  . Highest education level: Not on file  Occupational History  . Not on file  Tobacco Use  . Smoking status: Never Smoker  . Smokeless tobacco: Never Used  Substance and Sexual Activity  . Alcohol use: Not Currently    Comment: occ  . Drug use: No  . Sexual activity: Not on file  Other Topics Concern  . Not on file    Social History Narrative  . Not on file   Social Determinants of Health   Financial Resource Strain:   . Difficulty of Paying Living Expenses:   Food Insecurity:   . Worried About Programme researcher, broadcasting/film/video in the Last Year:   . Barista in the Last Year:   Transportation Needs:   . Freight forwarder (Medical):   Marland Kitchen Lack of Transportation (Non-Medical):   Physical Activity:   . Days of Exercise per Week:   . Minutes of Exercise per Session:   Stress:   . Feeling of Stress :   Social Connections:   . Frequency of Communication with Friends and Family:   . Frequency of Social Gatherings with Friends and Family:   . Attends Religious Services:   . Active Member of Clubs or Organizations:   . Attends Banker Meetings:   Marland Kitchen Marital Status:   Intimate Partner Violence:   . Fear of Current or Ex-Partner:   . Emotionally Abused:   Marland Kitchen Physically Abused:   . Sexually Abused:      ROS- All systems are reviewed and negative except as per the HPI above.  Physical Exam: Vitals:   06/13/19 1035  BP: 120/70  Pulse: (!) 128  Weight: 85.9 kg  Height: 5\' 3"  (1.6 m)    GEN- The patient is well appearing obese elderly female, alert and oriented x 3 today.   Head- normocephalic, atraumatic Eyes-  Sclera clear, conjunctiva pink Ears- hearing intact Oropharynx- clear Neck- supple  Lungs- Clear to ausculation bilaterally, normal work of breathing Heart- irregular rate and rhythm, tachycardia, no murmurs, rubs or gallops  GI- soft, NT, ND, + BS Extremities- no clubbing, cyanosis, or edema MS- no significant deformity or atrophy Skin- no rash or lesion Psych- euthymic mood, full affect Neuro- strength and sensation are intact  Wt Readings from Last 3 Encounters:  06/13/19 85.9 kg  06/11/19 81.2 kg  06/29/13 84.4 kg    EKG today demonstrates atypical atrial flutter HR 128, QRS 98, QTc 484  Epic records are reviewed at length today  CHA2DS2-VASc Score = 4 The  patient's score is based upon: CHF History: No HTN History: Yes Age : 57 + Diabetes History: No Stroke History: No Vascular Disease History: No Gender: Female      ASSESSMENT AND PLAN: 1. Atypical atrial flutter The patient's CHA2DS2-VASc score is 4, indicating a 4.8% annual risk of stroke.   General education about atrial flutter provided and questions answered. We also discussed her stroke risk and the risks and benefits of anticoagulation. We discussed therapeutic options today including DCCV +/- TEE. At this time, patient feels her symptoms are stable to wait for 3 weeks of uninterrupted anticoagulation (started 06/11/19). Continue Eliquis 5 mg BID Increase metoprolol to 50 mg BID Will defer echo for now until she is better rate controlled.  2. Secondary Hypercoagulable State (EOF12:  D68.69){Click to add to Prob List or Visit Dx  :[id number]} The patient is at significant risk for stroke/thromboembolism based upon her CHA2DS2-VASc Score of 4.  Continue Apixaban (Eliquis).   3. Obesity Body mass index is 33.55 kg/m. Lifestyle modification was discussed at length including regular exercise and weight reduction.  4. HTN Stable, med changes as above. Patient has been off losartan since her ER visit. Continue to hold to allow for more rate control.     Follow up in the AF clinic next week.     [facility] [address] [place], [place] [address] [phone] 06/13/2019 1:05 PM

## 2019-06-13 NOTE — Patient Instructions (Signed)
Increase metoprolol to 50mg  twice a day (2 of your 25mg  tablets twice a day)

## 2019-06-14 ENCOUNTER — Ambulatory Visit (HOSPITAL_COMMUNITY): Payer: PRIVATE HEALTH INSURANCE | Admitting: Physician Assistant

## 2019-06-18 ENCOUNTER — Ambulatory Visit (HOSPITAL_COMMUNITY)
Admission: RE | Admit: 2019-06-18 | Discharge: 2019-06-18 | Disposition: A | Discharge status: Home / Self Care | Payer: Medicare PPO | Source: Ambulatory Visit | Attending: Physician Assistant | Admitting: Physician Assistant

## 2019-06-18 ENCOUNTER — Other Ambulatory Visit: Payer: Self-pay

## 2019-06-18 ENCOUNTER — Encounter (HOSPITAL_COMMUNITY): Payer: Self-pay | Admitting: Physician Assistant

## 2019-06-18 ENCOUNTER — Other Ambulatory Visit (HOSPITAL_COMMUNITY): Payer: PRIVATE HEALTH INSURANCE

## 2019-06-18 VITALS — BP 140/88 | HR 109 | Ht 63.0 in | Wt 192.6 lb

## 2019-06-18 DIAGNOSIS — Z79899 Other long term (current) drug therapy: Secondary | ICD-10-CM | POA: Insufficient documentation

## 2019-06-18 DIAGNOSIS — Z91013 Allergy to seafood: Secondary | ICD-10-CM | POA: Diagnosis not present

## 2019-06-18 DIAGNOSIS — I484 Atypical atrial flutter: Secondary | ICD-10-CM | POA: Diagnosis not present

## 2019-06-18 DIAGNOSIS — I110 Hypertensive heart disease with heart failure: Secondary | ICD-10-CM | POA: Diagnosis not present

## 2019-06-18 DIAGNOSIS — Z7901 Long term (current) use of anticoagulants: Secondary | ICD-10-CM | POA: Diagnosis not present

## 2019-06-18 DIAGNOSIS — D6869 Other thrombophilia: Secondary | ICD-10-CM | POA: Insufficient documentation

## 2019-06-18 DIAGNOSIS — I509 Heart failure, unspecified: Secondary | ICD-10-CM | POA: Diagnosis not present

## 2019-06-18 DIAGNOSIS — E785 Hyperlipidemia, unspecified: Secondary | ICD-10-CM | POA: Diagnosis not present

## 2019-06-18 DIAGNOSIS — Z6834 Body mass index (BMI) 34.0-34.9, adult: Secondary | ICD-10-CM | POA: Diagnosis not present

## 2019-06-18 DIAGNOSIS — E669 Obesity, unspecified: Secondary | ICD-10-CM | POA: Insufficient documentation

## 2019-06-18 DIAGNOSIS — I4819 Other persistent atrial fibrillation: Secondary | ICD-10-CM | POA: Insufficient documentation

## 2019-06-18 LAB — BASIC METABOLIC PANEL
Anion gap: 10 (ref 5–15)
BUN: 13 mg/dL (ref 8–23)
CO2: 23 mmol/L (ref 22–32)
Calcium: 8.8 mg/dL — ABNORMAL LOW (ref 8.9–10.3)
Chloride: 108 mmol/L (ref 98–111)
Creatinine, Ser: 0.85 mg/dL (ref 0.44–1.00)
GFR calc Af Amer: >60 mL/min (ref >60)
GFR calc non Af Amer: >60 mL/min (ref >60)
Glucose, Bld: 110 mg/dL — ABNORMAL HIGH (ref 70–99)
Potassium: 3.8 mmol/L (ref 3.5–5.1)
Sodium: 141 mmol/L (ref 135–145)

## 2019-06-18 MED ORDER — FUROSEMIDE 20 MG PO TABS: ORAL_TABLET | ORAL | 1 refills | Status: DC

## 2019-06-18 NOTE — H&P (View-Only) (Signed)
° ° °Primary Care Physician: Patient, No Pcp Per °Primary Cardiologist: Dr Cooper °Primary Electrophysiologist: none °Referring Physician: Dr Cooper ° ° °Michelle Ayala is a 78 y.o. female with a history of HTN, HLD, persistent atrial fibrillation, and atypical atrial flutter who presents for follow up in the [facility]. The patient was initially diagnosed with atrial flutter on 06/11/19. She was found to be tachycardic when she presented for her cataract surgery and eventually came to the ED. In hindsight, she had symptoms of intermittent fatigue and SOB for the previous two weeks. Patient was started on Eliquis for a CHADS2VASC score of 4 and metoprolol for rate control. Admission was advised but patient refused. Patient remained in rapid atrial flutter on follow up 06/13/19 with symptoms of fatigue and SOB with exertion. Her wheezing resolved with IV lasix at the ER. She denies significant snoring or alcohol use. ° °On follow up today, patient reports that she initially felt better with increased BB but she is now having progressive dyspnea, orthopnea, and weight gain again. She denies any missed doses of anticoagulation. She is in atrial fibrillation today.  ° °Today, she denies symptoms of palpitations, chest pain, PND, dizziness, presyncope, syncope, snoring, daytime somnolence, bleeding, or neurologic sequela. The patient is tolerating medications without difficulties and is otherwise without complaint today.  ° ° °Atrial Fibrillation Risk Factors: ° °she does not have symptoms or diagnosis of sleep apnea. °she does not have a history of rheumatic fever. °she does not have a history of alcohol use. ° ° °she has a BMI of Body mass index is 34.12 kg/m².. °Filed Weights  ° 06/18/19 1133  °Weight: 87.4 kg  ° ° °No family history on file. ° ° °Atrial Fibrillation Management history: ° °Previous antiarrhythmic drugs: none °Previous cardioversions: none °Previous ablations: none °CHADS2VASC  score: 4 °Anticoagulation history: Eliquis ° ° °Past Medical History:  °Diagnosis Date  °• High cholesterol   °• Vertigo   ° °Past Surgical History:  °Procedure Laterality Date  °• BREAST SURGERY    ° cyst removal  °• CESAREAN SECTION    °• TUBAL LIGATION    ° ° °Current Outpatient Medications  °Medication Sig Dispense Refill  °• albuterol (PROVENTIL HFA;VENTOLIN HFA) 108 (90 BASE) MCG/ACT inhaler Inhale 1-2 puffs into the lungs every 6 (six) hours as needed for wheezing or shortness of breath.    °• apixaban (ELIQUIS) 5 MG TABS tablet Take 1 tablet (5 mg total) by mouth 2 (two) times daily. 60 tablet 2  °• cetirizine (ZYRTEC) 5 MG tablet Take 5 mg by mouth daily.    °• Cholecalciferol (VITAMIN D3) 25 MCG (1000 UT) CAPS Take 1 capsule by mouth daily.    °• dimenhyDRINATE (DRAMAMINE) 50 MG tablet Take 100 mg by mouth every 8 (eight) hours as needed for nausea or dizziness.    °• ezetimibe (ZETIA) 10 MG tablet Take 10 mg by mouth at bedtime.    °• fluticasone (FLONASE) 50 MCG/ACT nasal spray Place 1 spray into both nostrils daily as needed for allergies or rhinitis.    °• losartan (COZAAR) 25 MG tablet Take 25 mg by mouth daily.    °• meclizine (ANTIVERT) 25 MG tablet Take 1 tablet (25 mg total) by mouth 3 (three) times daily as needed for dizziness. 20 tablet 0  °• metoprolol tartrate (LOPRESSOR) 25 MG tablet Take 2 tablets (50 mg total) by mouth 2 (two) times daily. 60 tablet 2  °• naproxen sodium (ALEVE) 220 MG tablet   Take 220 mg by mouth daily as needed (For pain).    °• furosemide (LASIX) 20 MG tablet Take 1 tablet by mouth daily for the next 3 days then only as needed for weight gain 10 tablet 1  ° °No current facility-administered medications for this encounter.  ° ° °Allergies  °Allergen Reactions  °• Cinnamon Anaphylaxis  °• Shellfish Allergy Anaphylaxis  ° ° °Social History  ° °Socioeconomic History  °• Marital status: Divorced  °  Spouse name: Not on file  °• Number of children: Not on file  °• Years of  education: Not on file  °• Highest education level: Not on file  °Occupational History  °• Not on file  °Tobacco Use  °• Smoking status: Never Smoker  °• Smokeless tobacco: Never Used  °Substance and Sexual Activity  °• Alcohol use: Not Currently  °  Comment: occ  °• Drug use: No  °• Sexual activity: Not on file  °Other Topics Concern  °• Not on file  °Social History Narrative  °• Not on file  ° °Social Determinants of Health  ° °Financial Resource Strain:   °• Difficulty of Paying Living Expenses:   °Food Insecurity:   °• Worried About Running Out of Food in the Last Year:   °• Ran Out of Food in the Last Year:   °Transportation Needs:   °• Lack of Transportation (Medical):   °• Lack of Transportation (Non-Medical):   °Physical Activity:   °• Days of Exercise per Week:   °• Minutes of Exercise per Session:   °Stress:   °• Feeling of Stress :   °Social Connections:   °• Frequency of Communication with Friends and Family:   °• Frequency of Social Gatherings with Friends and Family:   °• Attends Religious Services:   °• Active Member of Clubs or Organizations:   °• Attends Club or Organization Meetings:   °• Marital Status:   °Intimate Partner Violence:   °• Fear of Current or Ex-Partner:   °• Emotionally Abused:   °• Physically Abused:   °• Sexually Abused:   ° ° ° °ROS- All systems are reviewed and negative except as per the HPI above. ° °Physical Exam: °Vitals:  ° 06/18/19 1133  °BP: 140/88  °Pulse: (!) 109  °Weight: 87.4 kg  °Height: 5' 3" (1.6 m)  ° ° °GEN- The patient is well appearing obese elderly female, alert and oriented x 3 today.   °HEENT-head normocephalic, atraumatic, sclera clear, conjunctiva pink, hearing intact, trachea midline. °Lungs- Clear to ausculation bilaterally, normal work of breathing °Heart- irregular rate and rhythm, no murmurs, rubs or gallops  °GI- soft, NT, ND, + BS °Extremities- no clubbing, cyanosis. Non pitting edema °MS- no significant deformity or atrophy °Skin- no rash or  lesion °Psych- euthymic mood, full affect °Neuro- strength and sensation are intact ° ° °Wt Readings from Last 3 Encounters:  °06/18/19 87.4 kg  °06/13/19 85.9 kg  °06/11/19 81.2 kg  ° ° °EKG today demonstrates afib HR 109, inc RBBB, QRS 96, QTc 474 ° °Epic records are reviewed at length today ° °CHA2DS2-VASc Score = 4 °The patient's score is based upon: °CHF History: No °HTN History: Yes °Age : 73 + °Diabetes History: No °Stroke History: No °Vascular Disease History: No °Gender: Female  ° ° °ASSESSMENT AND PLAN: °1. Atypical atrial flutter/persistent atrial fibrillation °The patient's CHA2DS2-VASc score is 4, indicating a 4.8% annual risk of stroke.   °Patient remains in afib with mildly elevated rates.  °We   discussed therapeutic options again today including DCCV +/- TEE. After discussing the risks and benefits, will plan for TEE/DCCV given her worsening symptoms of dyspnea and orthopnea.  °Continue Eliquis 5 mg BID. Patient denies any missed doses.  °Continue metoprolol to 50 mg BID  °Check bmet ° °2. Secondary Hypercoagulable State (ICD10:  D68.69) °The patient is at significant risk for stroke/thromboembolism based upon her CHA2DS2-VASc Score of 4.  Continue Apixaban (Eliquis).  ° °3. Obesity °Body mass index is 34.12 kg/m². °Lifestyle modification was discussed and encouraged including regular physical activity and weight reduction. ° °4. HTN °Stable, no changes today. ° °5. Acute CHF °Patient again having progressive wheezing and dyspnea with weight gain. Likely due to #1. Plan for TEE/DCCV as above. Will plan to start Lasix 20 mg daily until DCCV. Bmet as above. ° ° ° °Follow up one week post DCCV. ° ° °Addendum: Offered TEE/DCCV for 06/20/19. Patient and family decided to wait until Monday 06/24/19. I recommended sooner DCCV given her symptoms. ER precautions given. ° ° °Ricky Laurieanne Galloway PA-C °[facility] °[facility] °[address] °, [place] [address] °[phone] °06/18/2019 °12:23 PM °

## 2019-06-18 NOTE — Progress Notes (Addendum)
Primary Care Physician: Patient, No Pcp Per Primary Cardiologist: Dr Excell Seltzer Primary Electrophysiologist: none Referring Physician: Dr Wilfred Curtis is a 79 y.o. female with a history of HTN, HLD, persistent atrial fibrillation, and atypical atrial flutter who presents for follow up in the [facility]. The patient was initially diagnosed with atrial flutter on 06/11/19. She was found to be tachycardic when she presented for her cataract surgery and eventually came to the ED. In hindsight, she had symptoms of intermittent fatigue and SOB for the previous two weeks. Patient was started on Eliquis for a CHADS2VASC score of 4 and metoprolol for rate control. Admission was advised but patient refused. Patient remained in rapid atrial flutter on follow up 06/13/19 with symptoms of fatigue and SOB with exertion. Her wheezing resolved with IV lasix at the ER. She denies significant snoring or alcohol use.  On follow up today, patient reports that she initially felt better with increased BB but she is now having progressive dyspnea, orthopnea, and weight gain again. She denies any missed doses of anticoagulation. She is in atrial fibrillation today.   Today, she denies symptoms of palpitations, chest pain, PND, dizziness, presyncope, syncope, snoring, daytime somnolence, bleeding, or neurologic sequela. The patient is tolerating medications without difficulties and is otherwise without complaint today.    Atrial Fibrillation Risk Factors:  she does not have symptoms or diagnosis of sleep apnea. she does not have a history of rheumatic fever. she does not have a history of alcohol use.   she has a BMI of Body mass index is 34.12 kg/m.Marland Kitchen Filed Weights   06/18/19 1133  Weight: 87.4 kg    No family history on file.   Atrial Fibrillation Management history:  Previous antiarrhythmic drugs: none Previous cardioversions: none Previous ablations: none CHADS2VASC  score: 4 Anticoagulation history: Eliquis   Past Medical History:  Diagnosis Date   High cholesterol    Vertigo    Past Surgical History:  Procedure Laterality Date   BREAST SURGERY     cyst removal   CESAREAN SECTION     TUBAL LIGATION      Current Outpatient Medications  Medication Sig Dispense Refill   albuterol (PROVENTIL HFA;VENTOLIN HFA) 108 (90 BASE) MCG/ACT inhaler Inhale 1-2 puffs into the lungs every 6 (six) hours as needed for wheezing or shortness of breath.     apixaban (ELIQUIS) 5 MG TABS tablet Take 1 tablet (5 mg total) by mouth 2 (two) times daily. 60 tablet 2   cetirizine (ZYRTEC) 5 MG tablet Take 5 mg by mouth daily.     Cholecalciferol (VITAMIN D3) 25 MCG (1000 UT) CAPS Take 1 capsule by mouth daily.     dimenhyDRINATE (DRAMAMINE) 50 MG tablet Take 100 mg by mouth every 8 (eight) hours as needed for nausea or dizziness.     ezetimibe (ZETIA) 10 MG tablet Take 10 mg by mouth at bedtime.     fluticasone (FLONASE) 50 MCG/ACT nasal spray Place 1 spray into both nostrils daily as needed for allergies or rhinitis.     losartan (COZAAR) 25 MG tablet Take 25 mg by mouth daily.     meclizine (ANTIVERT) 25 MG tablet Take 1 tablet (25 mg total) by mouth 3 (three) times daily as needed for dizziness. 20 tablet 0   metoprolol tartrate (LOPRESSOR) 25 MG tablet Take 2 tablets (50 mg total) by mouth 2 (two) times daily. 60 tablet 2   naproxen sodium (ALEVE) 220 MG tablet  Take 220 mg by mouth daily as needed (For pain).     furosemide (LASIX) 20 MG tablet Take 1 tablet by mouth daily for the next 3 days then only as needed for weight gain 10 tablet 1   No current facility-administered medications for this encounter.    Allergies  Allergen Reactions   Cinnamon Anaphylaxis   Shellfish Allergy Anaphylaxis    Social History   Socioeconomic History   Marital status: Divorced    Spouse name: Not on file   Number of children: Not on file   Years of  education: Not on file   Highest education level: Not on file  Occupational History   Not on file  Tobacco Use   Smoking status: Never Smoker   Smokeless tobacco: Never Used  Substance and Sexual Activity   Alcohol use: Not Currently    Comment: occ   Drug use: No   Sexual activity: Not on file  Other Topics Concern   Not on file  Social History Narrative   Not on file   Social Determinants of Health   Financial Resource Strain:    Difficulty of Paying Living Expenses:   Food Insecurity:    Worried About Programme researcher, broadcasting/film/video in the Last Year:    Barista in the Last Year:   Transportation Needs:    Freight forwarder (Medical):    Lack of Transportation (Non-Medical):   Physical Activity:    Days of Exercise per Week:    Minutes of Exercise per Session:   Stress:    Feeling of Stress :   Social Connections:    Frequency of Communication with Friends and Family:    Frequency of Social Gatherings with Friends and Family:    Attends Religious Services:    Active Member of Clubs or Organizations:    Attends Engineer, structural:    Marital Status:   Intimate Partner Violence:    Fear of Current or Ex-Partner:    Emotionally Abused:    Physically Abused:    Sexually Abused:      ROS- All systems are reviewed and negative except as per the HPI above.  Physical Exam: Vitals:   06/18/19 1133  BP: 140/88  Pulse: (!) 109  Weight: 87.4 kg  Height: 5\' 3"  (1.6 m)    GEN- The patient is well appearing obese elderly female, alert and oriented x 3 today.   HEENT-head normocephalic, atraumatic, sclera clear, conjunctiva pink, hearing intact, trachea midline. Lungs- Clear to ausculation bilaterally, normal work of breathing Heart- irregular rate and rhythm, no murmurs, rubs or gallops  GI- soft, NT, ND, + BS Extremities- no clubbing, cyanosis. Non pitting edema MS- no significant deformity or atrophy Skin- no rash or  lesion Psych- euthymic mood, full affect Neuro- strength and sensation are intact   Wt Readings from Last 3 Encounters:  06/18/19 87.4 kg  06/13/19 85.9 kg  06/11/19 81.2 kg    EKG today demonstrates afib HR 109, inc RBBB, QRS 96, QTc 474  Epic records are reviewed at length today  CHA2DS2-VASc Score = 4 The patient's score is based upon: CHF History: No HTN History: Yes Age : 37 + Diabetes History: No Stroke History: No Vascular Disease History: No Gender: Female    ASSESSMENT AND PLAN: 1. Atypical atrial flutter/persistent atrial fibrillation The patient's CHA2DS2-VASc score is 4, indicating a 4.8% annual risk of stroke.   Patient remains in afib with mildly elevated rates.  We  discussed therapeutic options again today including DCCV +/- TEE. After discussing the risks and benefits, will plan for TEE/DCCV given her worsening symptoms of dyspnea and orthopnea.  Continue Eliquis 5 mg BID. Patient denies any missed doses.  Continue metoprolol to 50 mg BID  Check bmet  2. Secondary Hypercoagulable State (ICD10:  D68.69) The patient is at significant risk for stroke/thromboembolism based upon her CHA2DS2-VASc Score of 4.  Continue Apixaban (Eliquis).   3. Obesity Body mass index is 34.12 kg/m. Lifestyle modification was discussed and encouraged including regular physical activity and weight reduction.  4. HTN Stable, no changes today.  5. Acute CHF Patient again having progressive wheezing and dyspnea with weight gain. Likely due to #1. Plan for TEE/DCCV as above. Will plan to start Lasix 20 mg daily until DCCV. Bmet as above.    Follow up one week post DCCV.   Addendum: Offered TEE/DCCV for 06/20/19. Patient and family decided to wait until Monday 06/24/19. I recommended sooner DCCV given her symptoms. ER precautions given.   [facility] [address] [place], [place] [address] [phone] 06/18/2019 12:23 PM

## 2019-06-18 NOTE — Patient Instructions (Addendum)
Cardioversion scheduled for Monday, April 19th  - Arrive at the [facility] and go to admitting at 10:30AM  -Do not eat or drink anything after midnight the night prior to your procedure.  - Take all your morning medication with a sip of water prior to arrival.  - You will not be able to drive home after your procedure.   Start lasix 20mg  once a day until Monday then stop

## 2019-06-20 ENCOUNTER — Other Ambulatory Visit: Payer: Self-pay

## 2019-06-20 ENCOUNTER — Telehealth: Payer: Self-pay | Admitting: Cardiovascular Disease

## 2019-06-20 ENCOUNTER — Other Ambulatory Visit (HOSPITAL_COMMUNITY)
Admission: RE | Admit: 2019-06-20 | Discharge: 2019-06-20 | Disposition: A | Discharge status: Home / Self Care | Payer: Medicare PPO | Source: Ambulatory Visit | Attending: Internal Medicine | Admitting: Internal Medicine

## 2019-06-20 DIAGNOSIS — Z01812 Encounter for preprocedural laboratory examination: Secondary | ICD-10-CM | POA: Insufficient documentation

## 2019-06-20 DIAGNOSIS — Z20822 Contact with and (suspected) exposure to covid-19: Secondary | ICD-10-CM | POA: Insufficient documentation

## 2019-06-20 NOTE — Telephone Encounter (Signed)
Dr. Excell Seltzer personally spoke with Dorinda Hill. All questions were answered. Testing will go as planned for Michelle Ayala.

## 2019-06-20 NOTE — Telephone Encounter (Signed)
New Message    Pts son is calling and would like to talk to Dr Excell Seltzer about the pts visit in the Hospital  He says he is in another state and couldn't be there and is wanting know about her hospital visit  Pts son is requesting for the nurse to call him first to let him know a scheduled time Dr Excell Seltzer can call him so he does not miss his call....   Please advise

## 2019-06-20 NOTE — Telephone Encounter (Signed)
Dr. Excell Seltzer called and left a message for Michelle Ayala.  He will try again today before leaving Clinic. Phone: [phone]

## 2019-06-20 NOTE — Telephone Encounter (Signed)
Spoke with [facility] nurse.  The patient's son would like to speak with Dr. Excell Seltzer directly about his mother's care and plan. Per Afib RN, the patient's son is delaying care despite gaining 8 pounds in a week (TEE/DCCV could have occured today but is scheduled for this Monday).

## 2019-06-21 LAB — SARS CORONAVIRUS 2 (TAT 6-24 HRS): SARS Coronavirus 2: NEGATIVE

## 2019-06-24 ENCOUNTER — Encounter (HOSPITAL_COMMUNITY)
Admission: RE | Disposition: A | Discharge status: Home / Self Care | Payer: Self-pay | Source: Home / Self Care | Attending: Internal Medicine

## 2019-06-24 ENCOUNTER — Ambulatory Visit (HOSPITAL_COMMUNITY): Payer: Medicare PPO | Admitting: Certified Registered"

## 2019-06-24 ENCOUNTER — Other Ambulatory Visit: Payer: Self-pay

## 2019-06-24 ENCOUNTER — Ambulatory Visit (HOSPITAL_COMMUNITY)
Admission: RE | Admit: 2019-06-24 | Discharge: 2019-06-24 | Disposition: A | Discharge status: Home / Self Care | Payer: Medicare PPO | Attending: Internal Medicine | Admitting: Internal Medicine

## 2019-06-24 ENCOUNTER — Encounter (HOSPITAL_COMMUNITY): Payer: Self-pay | Admitting: Internal Medicine

## 2019-06-24 ENCOUNTER — Ambulatory Visit (HOSPITAL_BASED_OUTPATIENT_CLINIC_OR_DEPARTMENT_OTHER): Payer: Medicare PPO

## 2019-06-24 DIAGNOSIS — I509 Heart failure, unspecified: Secondary | ICD-10-CM | POA: Diagnosis not present

## 2019-06-24 DIAGNOSIS — Z6834 Body mass index (BMI) 34.0-34.9, adult: Secondary | ICD-10-CM | POA: Diagnosis not present

## 2019-06-24 DIAGNOSIS — E669 Obesity, unspecified: Secondary | ICD-10-CM | POA: Diagnosis not present

## 2019-06-24 DIAGNOSIS — R001 Bradycardia, unspecified: Secondary | ICD-10-CM | POA: Diagnosis not present

## 2019-06-24 DIAGNOSIS — I340 Nonrheumatic mitral (valve) insufficiency: Secondary | ICD-10-CM | POA: Diagnosis not present

## 2019-06-24 DIAGNOSIS — I083 Combined rheumatic disorders of mitral, aortic and tricuspid valves: Secondary | ICD-10-CM | POA: Insufficient documentation

## 2019-06-24 DIAGNOSIS — Z7901 Long term (current) use of anticoagulants: Secondary | ICD-10-CM | POA: Diagnosis not present

## 2019-06-24 DIAGNOSIS — I110 Hypertensive heart disease with heart failure: Secondary | ICD-10-CM | POA: Diagnosis not present

## 2019-06-24 DIAGNOSIS — E7800 Pure hypercholesterolemia, unspecified: Secondary | ICD-10-CM | POA: Diagnosis not present

## 2019-06-24 DIAGNOSIS — Q211 Atrial septal defect: Secondary | ICD-10-CM | POA: Insufficient documentation

## 2019-06-24 DIAGNOSIS — I313 Pericardial effusion (noninflammatory): Secondary | ICD-10-CM | POA: Diagnosis not present

## 2019-06-24 DIAGNOSIS — D6869 Other thrombophilia: Secondary | ICD-10-CM | POA: Insufficient documentation

## 2019-06-24 DIAGNOSIS — I4819 Other persistent atrial fibrillation: Secondary | ICD-10-CM | POA: Diagnosis not present

## 2019-06-24 DIAGNOSIS — I484 Atypical atrial flutter: Secondary | ICD-10-CM | POA: Diagnosis not present

## 2019-06-24 DIAGNOSIS — E785 Hyperlipidemia, unspecified: Secondary | ICD-10-CM | POA: Insufficient documentation

## 2019-06-24 DIAGNOSIS — Z79899 Other long term (current) drug therapy: Secondary | ICD-10-CM | POA: Diagnosis not present

## 2019-06-24 DIAGNOSIS — I361 Nonrheumatic tricuspid (valve) insufficiency: Secondary | ICD-10-CM

## 2019-06-24 HISTORY — PX: TEE WITHOUT CARDIOVERSION: SHX5443

## 2019-06-24 HISTORY — PX: CARDIOVERSION: SHX1299

## 2019-06-24 HISTORY — PX: BUBBLE STUDY: SHX6837

## 2019-06-24 SURGERY — ECHOCARDIOGRAM, TRANSESOPHAGEAL
Anesthesia: Monitor Anesthesia Care

## 2019-06-24 MED ORDER — SODIUM CHLORIDE 0.9 % IV SOLN: INTRAVENOUS | Status: DC

## 2019-06-24 MED ORDER — SODIUM CHLORIDE 0.9 % IV SOLN: INTRAVENOUS | Status: DC | PRN

## 2019-06-24 MED ORDER — PROPOFOL 10 MG/ML IV BOLUS
INTRAVENOUS | Status: DC | PRN
  Administered 2019-06-24: 30 mg via INTRAVENOUS
  Administered 2019-06-24: 50 mg via INTRAVENOUS

## 2019-06-24 MED ORDER — LIDOCAINE 2% (20 MG/ML) 5 ML SYRINGE
INTRAMUSCULAR | Status: DC | PRN
  Administered 2019-06-24: 50 mg via INTRAVENOUS

## 2019-06-24 MED ORDER — LACTATED RINGERS IV SOLN: INTRAVENOUS | Status: DC | PRN

## 2019-06-24 MED ORDER — PHENYLEPHRINE 40 MCG/ML (10ML) SYRINGE FOR IV PUSH (FOR BLOOD PRESSURE SUPPORT)
PREFILLED_SYRINGE | INTRAVENOUS | Status: DC | PRN
  Administered 2019-06-24: 80 ug via INTRAVENOUS

## 2019-06-24 MED ORDER — PROPOFOL 500 MG/50ML IV EMUL
INTRAVENOUS | Status: DC | PRN
  Administered 2019-06-24: 50 ug/kg/min via INTRAVENOUS

## 2019-06-24 MED ORDER — PHENYLEPHRINE HCL-NACL 10-0.9 MG/250ML-% IV SOLN
INTRAVENOUS | Status: DC | PRN
  Administered 2019-06-24: 40 ug/min via INTRAVENOUS

## 2019-06-24 MED ORDER — LACTATED RINGERS IV SOLN
INTRAVENOUS | Status: AC | PRN
  Administered 2019-06-24: 1000 mL via INTRAVENOUS

## 2019-06-24 NOTE — Transfer of Care (Signed)
Immediate Anesthesia Transfer of Care Note  Patient: Michelle Ayala  Procedure(s) Performed: TRANSESOPHAGEAL ECHOCARDIOGRAM (TEE) (N/A ) CARDIOVERSION (N/A ) BUBBLE STUDY  Patient Location: PACU and Endoscopy Unit  Anesthesia Type:MAC  Level of Consciousness: drowsy  Airway & Oxygen Therapy: Patient Spontanous Breathing and Patient connected to nasal cannula oxygen  Post-op Assessment: Report given to RN, Post -op Vital signs reviewed and stable and Patient moving all extremities  Post vital signs: Reviewed and stable  Last Vitals:  Vitals Value Taken Time  BP 114/57 06/24/19 1252  Temp    Pulse 57 06/24/19 1254  Resp 16 06/24/19 1254  SpO2 95 % 06/24/19 1254  Vitals shown include unvalidated device data.  Last Pain:  Vitals:   06/24/19 1040  TempSrc: Oral  PainSc: 0-No pain         Complications: No apparent anesthesia complications

## 2019-06-24 NOTE — Anesthesia Preprocedure Evaluation (Addendum)
Anesthesia Evaluation  Patient identified by MRN, date of birth, ID band Patient awake    Reviewed: Allergy & Precautions, NPO status , Patient's Chart, lab work & pertinent test results, reviewed documented beta blocker date and time   History of Anesthesia Complications Negative for: history of anesthetic complications  Airway Mallampati: II  TM Distance: >3 FB Neck ROM: Full    Dental  (+) Dental Advisory Given, Caps, Missing   Pulmonary neg pulmonary ROS,    Pulmonary exam normal        Cardiovascular hypertension, Pt. on home beta blockers and Pt. on medications + dysrhythmias Atrial Fibrillation  Rhythm:Irregular Rate:Tachycardia     Neuro/Psych  Vertigo  negative psych ROS   GI/Hepatic negative GI ROS, Neg liver ROS,   Endo/Other   Obesity   Renal/GU negative Renal ROS     Musculoskeletal negative musculoskeletal ROS (+)   Abdominal   Peds  Hematology  On eliquis    Anesthesia Other Findings Covid neg 4/15   Reproductive/Obstetrics                           Anesthesia Physical Anesthesia Plan  ASA: III  Anesthesia Plan: General and MAC   Post-op Pain Management:    Induction: Intravenous  PONV Risk Score and Plan: 3 and Treatment may vary due to age or medical condition and Propofol infusion  Airway Management Planned: Natural Airway and Nasal Cannula  Additional Equipment: None  Intra-op Plan:   Post-operative Plan:   Informed Consent: I have reviewed the patients History and Physical, chart, labs and discussed the procedure including the risks, benefits and alternatives for the proposed anesthesia with the patient or authorized representative who has indicated his/her understanding and acceptance.       Plan Discussed with: CRNA and Anesthesiologist  Anesthesia Plan Comments: (May begin TEE as MAC, GA with natural airway if performing cardioversion)        Anesthesia Quick Evaluation

## 2019-06-24 NOTE — Anesthesia Procedure Notes (Signed)
Procedure Name: MAC Date/Time: 06/24/2019 12:22 PM Performed by: Orlie Dakin, CRNA Pre-anesthesia Checklist: Patient identified, Emergency Drugs available, Suction available and Patient being monitored Patient Re-evaluated:Patient Re-evaluated prior to induction Oxygen Delivery Method: Nasal cannula Preoxygenation: Pre-oxygenation with 100% oxygen Induction Type: IV induction Placement Confirmation: positive ETCO2

## 2019-06-24 NOTE — Interval H&P Note (Signed)
History and Physical Interval Note:  06/24/2019 12:01 PM  Michelle Ayala  has presented today for surgery, with the diagnosis of AFIB.  The various methods of treatment have been discussed with the patient and family. After consideration of risks, benefits and other options for treatment, the patient has consented to  Procedure(s): TRANSESOPHAGEAL ECHOCARDIOGRAM (TEE) (N/A) CARDIOVERSION (N/A) as a surgical intervention.  The patient's history has been reviewed, patient examined, no change in status, stable for surgery.  I have reviewed the patient's chart and labs.  Questions were answered to the patient's satisfaction.     Parke Poisson

## 2019-06-24 NOTE — Progress Notes (Signed)
  Echocardiogram Echocardiogram Transesophageal has been performed.  Michelle Ayala A Michelle Ayala 06/24/2019, 1:00 PM

## 2019-06-24 NOTE — Anesthesia Postprocedure Evaluation (Signed)
Anesthesia Post Note  Patient: Michelle Ayala  Procedure(s) Performed: TRANSESOPHAGEAL ECHOCARDIOGRAM (TEE) (N/A ) CARDIOVERSION (N/A ) BUBBLE STUDY     Patient location during evaluation: PACU Anesthesia Type: General Level of consciousness: awake and alert Pain management: pain level controlled Vital Signs Assessment: post-procedure vital signs reviewed and stable Respiratory status: spontaneous breathing, nonlabored ventilation and respiratory function stable Cardiovascular status: blood pressure returned to baseline and stable Postop Assessment: no apparent nausea or vomiting Anesthetic complications: no    Last Vitals:  Vitals:   06/24/19 1252 06/24/19 1300  BP: (!) 114/57 121/73  Pulse: (!) 54 (!) 58  Resp: (!) 22 16  Temp:  36.5 C  SpO2: 95% 97%    Last Pain:  Vitals:   06/24/19 1300  TempSrc: Oral  PainSc:                  Beryle Lathe

## 2019-06-24 NOTE — CV Procedure (Signed)
INDICATIONS: Atrial fibrillation  PROCEDURE:   Informed consent was obtained prior to the procedure. The risks, benefits and alternatives for the procedure were discussed and the patient comprehended these risks.  Risks include, but are not limited to, cough, sore throat, vomiting, nausea, somnolence, esophageal and stomach trauma or perforation, bleeding, low blood pressure, aspiration, pneumonia, infection, trauma to the teeth and death.    After a procedural time-out, the oropharynx was anesthetized with 20% benzocaine spray.   During this procedure the patient was administered propofol deep monitored sedation.  The patient's heart rate, blood pressure, and oxygen saturationweare monitored continuously during the procedure. The period of conscious sedation was 45 minutes, of which I was present face-to-face 100% of this time.  The transesophageal probe was inserted in the esophagus and stomach without difficulty and multiple views were obtained.  The patient was kept under observation until the patient left the procedure room.  The patient left the procedure room in stable condition.   Agitated microbubble saline contrast was administered.  COMPLICATIONS:    There were no immediate complications.  FINDINGS:  No LA appendage thrombus.  No LV apical thrombus.  Formal report to follow.   RECOMMENDATIONS:     Proceed to cardioversion.  Time Spent Directly with the Patient:  30 minutes   Procedure: Electrical Cardioversion Indications:  Atrial Fibrillation  Procedure Details:  Consent: Risks of procedure as well as the alternatives and risks of each were explained to the (patient/caregiver).  Consent for procedure obtained.  Time Out: Verified patient identification, verified procedure, site/side was marked, verified correct patient position, special equipment/implants available, medications/allergies/relevent history reviewed, required imaging and test results available.  PERFORMED.  Patient placed on cardiac monitor, pulse oximetry, supplemental oxygen as necessary.  Sedation given: propofol per anesthesia Pacer pads placed anterior and posterior chest.  Cardioverted 2 time(s).  Cardioversion with synchronized biphasic 120, 200J shock.  Evaluation: Findings: Post procedure EKG shows: Sinus bradycardia with PACs Complications: None Patient did tolerate procedure well.  Time Spent Directly with the Patient:  30 minutes   Parke Poisson 06/24/2019, 12:50 PM

## 2019-06-27 ENCOUNTER — Ambulatory Visit (HOSPITAL_COMMUNITY): Payer: PRIVATE HEALTH INSURANCE | Admitting: Physician Assistant

## 2019-06-28 ENCOUNTER — Other Ambulatory Visit: Payer: Self-pay

## 2019-06-28 ENCOUNTER — Ambulatory Visit (HOSPITAL_COMMUNITY)
Admission: RE | Admit: 2019-06-28 | Discharge: 2019-06-28 | Disposition: A | Discharge status: Home / Self Care | Payer: Medicare PPO | Source: Ambulatory Visit | Attending: Physician Assistant | Admitting: Physician Assistant

## 2019-06-28 ENCOUNTER — Encounter (HOSPITAL_COMMUNITY): Payer: Self-pay | Admitting: Physician Assistant

## 2019-06-28 VITALS — BP 160/90 | HR 124 | Ht 63.0 in | Wt 187.4 lb

## 2019-06-28 DIAGNOSIS — E669 Obesity, unspecified: Secondary | ICD-10-CM | POA: Insufficient documentation

## 2019-06-28 DIAGNOSIS — Z6833 Body mass index (BMI) 33.0-33.9, adult: Secondary | ICD-10-CM | POA: Insufficient documentation

## 2019-06-28 DIAGNOSIS — I4510 Unspecified right bundle-branch block: Secondary | ICD-10-CM | POA: Diagnosis not present

## 2019-06-28 DIAGNOSIS — Z7901 Long term (current) use of anticoagulants: Secondary | ICD-10-CM | POA: Diagnosis not present

## 2019-06-28 DIAGNOSIS — I484 Atypical atrial flutter: Secondary | ICD-10-CM | POA: Insufficient documentation

## 2019-06-28 DIAGNOSIS — E7800 Pure hypercholesterolemia, unspecified: Secondary | ICD-10-CM | POA: Insufficient documentation

## 2019-06-28 DIAGNOSIS — I4819 Other persistent atrial fibrillation: Secondary | ICD-10-CM | POA: Diagnosis not present

## 2019-06-28 DIAGNOSIS — Z79899 Other long term (current) drug therapy: Secondary | ICD-10-CM | POA: Diagnosis not present

## 2019-06-28 DIAGNOSIS — I5030 Unspecified diastolic (congestive) heart failure: Secondary | ICD-10-CM | POA: Diagnosis not present

## 2019-06-28 DIAGNOSIS — I110 Hypertensive heart disease with heart failure: Secondary | ICD-10-CM | POA: Diagnosis not present

## 2019-06-28 DIAGNOSIS — D6869 Other thrombophilia: Secondary | ICD-10-CM | POA: Diagnosis not present

## 2019-06-28 LAB — TSH: TSH: 2.291 u[IU]/mL (ref 0.350–4.500)

## 2019-06-28 MED ORDER — APIXABAN 5 MG PO TABS: 5.0000 mg | ORAL_TABLET | Freq: Two times a day (BID) | ORAL | 3 refills | Status: DC

## 2019-06-28 MED ORDER — METOPROLOL TARTRATE 50 MG PO TABS: 50.0000 mg | ORAL_TABLET | Freq: Two times a day (BID) | ORAL | 3 refills | Status: DC

## 2019-06-28 NOTE — Progress Notes (Signed)
Primary Care Physician: Tanna Furry, MD Primary Cardiologist: none Primary Electrophysiologist: none Referring Physician: Dr Excell Seltzer (seen in ER)   Michelle Ayala is a 79 y.o. female with a history of HTN, HLD, persistent atrial fibrillation, and atypical atrial flutter who presents for follow up in the [facility]. The patient was initially diagnosed with atrial flutter on 06/11/19. She was found to be tachycardic when she presented for her cataract surgery and eventually came to the ED. In hindsight, she had symptoms of intermittent fatigue and SOB for the previous two weeks. Patient was started on Eliquis for a CHADS2VASC score of 4 and metoprolol for rate control. Admission was advised but patient refused. Patient remained in rapid atrial flutter on follow up 06/13/19 with symptoms of fatigue and SOB with exertion. Her wheezing resolved with IV lasix at the ER. She denies significant snoring or alcohol use.  On follow up today, patient is s/p TEE/DCCV on 06/24/19. Her metoprolol was d/c'd after the procedure for bradycardia. Unfortunately, she is back out of rhythm today. She does feel much better however with less SOB and more energy and her weight is down. She does admit to insomnia.   Today, she denies symptoms of palpitations, chest pain, PND, dizziness, presyncope, syncope, snoring, daytime somnolence, bleeding, or neurologic sequela. The patient is tolerating medications without difficulties and is otherwise without complaint today.    Atrial Fibrillation Risk Factors:  she does not have symptoms or diagnosis of sleep apnea. she does not have a history of rheumatic fever. she does not have a history of alcohol use.   she has a BMI of Body mass index is 33.2 kg/m.Marland Kitchen Filed Weights   06/28/19 1134  Weight: 85 kg    No family history on file.   Atrial Fibrillation Management history:  Previous antiarrhythmic drugs: none Previous cardioversions:  06/24/19 Previous ablations: none CHADS2VASC score: 4 Anticoagulation history: Eliquis   Past Medical History:  Diagnosis Date  . High cholesterol   . Vertigo    Past Surgical History:  Procedure Laterality Date  . BREAST SURGERY     cyst removal  . BUBBLE STUDY  06/24/2019   Procedure: BUBBLE STUDY;  Surgeon: Parke Poisson, MD;  Location: [facility] ENDOSCOPY;  Service: Cardiovascular;;  . CARDIOVERSION N/A 06/24/2019   Procedure: CARDIOVERSION;  Surgeon: Parke Poisson, MD;  Location: [facility] ENDOSCOPY;  Service: Cardiovascular;  Laterality: N/A;  . CESAREAN SECTION    . TEE WITHOUT CARDIOVERSION N/A 06/24/2019   Procedure: TRANSESOPHAGEAL ECHOCARDIOGRAM (TEE);  Surgeon: Parke Poisson, MD;  Location: [facility] ENDOSCOPY;  Service: Cardiovascular;  Laterality: N/A;  . TUBAL LIGATION      Current Outpatient Medications  Medication Sig Dispense Refill  . albuterol (PROVENTIL HFA;VENTOLIN HFA) 108 (90 BASE) MCG/ACT inhaler Inhale 1-2 puffs into the lungs every 6 (six) hours as needed for wheezing or shortness of breath.    Marland Kitchen apixaban (ELIQUIS) 5 MG TABS tablet Take 1 tablet (5 mg total) by mouth 2 (two) times daily. 60 tablet 3  . cetirizine (ZYRTEC) 5 MG tablet Take 5 mg by mouth daily.    . Cholecalciferol (VITAMIN D3) 25 MCG (1000 UT) CAPS Take 1 capsule by mouth daily.    Marland Kitchen dimenhyDRINATE (DRAMAMINE) 50 MG tablet Take 100 mg by mouth every 8 (eight) hours as needed for nausea or dizziness.    . ezetimibe-simvastatin (VYTORIN) 10-40 MG tablet Take 1 tablet by mouth at bedtime.    . fluticasone (FLONASE) 50  MCG/ACT nasal spray Place 1 spray into both nostrils daily as needed for allergies or rhinitis.    . furosemide (LASIX) 20 MG tablet Take 1 tablet by mouth daily for the next 3 days then only as needed for weight gain 10 tablet 1  . losartan (COZAAR) 25 MG tablet Take 25 mg by mouth daily.    . meclizine (ANTIVERT) 25 MG tablet Take 1 tablet (25 mg total) by mouth 3 (three) times daily  as needed for dizziness. 20 tablet 0  . metoprolol tartrate (LOPRESSOR) 50 MG tablet Take 1 tablet (50 mg total) by mouth 2 (two) times daily. 60 tablet 3   No current facility-administered medications for this encounter.    Allergies  Allergen Reactions  . Cinnamon Anaphylaxis  . Shellfish Allergy Anaphylaxis    Social History   Socioeconomic History  . Marital status: Divorced    Spouse name: Not on file  . Number of children: Not on file  . Years of education: Not on file  . Highest education level: Not on file  Occupational History  . Not on file  Tobacco Use  . Smoking status: Never Smoker  . Smokeless tobacco: Never Used  Substance and Sexual Activity  . Alcohol use: Not Currently    Comment: occ  . Drug use: No  . Sexual activity: Not on file  Other Topics Concern  . Not on file  Social History Narrative  . Not on file   Social Determinants of Health   Financial Resource Strain:   . Difficulty of Paying Living Expenses:   Food Insecurity:   . Worried About Programme researcher, broadcasting/film/video in the Last Year:   . Barista in the Last Year:   Transportation Needs:   . Freight forwarder (Medical):   Marland Kitchen Lack of Transportation (Non-Medical):   Physical Activity:   . Days of Exercise per Week:   . Minutes of Exercise per Session:   Stress:   . Feeling of Stress :   Social Connections:   . Frequency of Communication with Friends and Family:   . Frequency of Social Gatherings with Friends and Family:   . Attends Religious Services:   . Active Member of Clubs or Organizations:   . Attends Banker Meetings:   Marland Kitchen Marital Status:   Intimate Partner Violence:   . Fear of Current or Ex-Partner:   . Emotionally Abused:   Marland Kitchen Physically Abused:   . Sexually Abused:      ROS- All systems are reviewed and negative except as per the HPI above.  Physical Exam: Vitals:   06/28/19 1134  BP: (!) 160/90  Pulse: (!) 124  Weight: 85 kg  Height: 5\' 3"  (1.6  m)    GEN- The patient is well appearing elderly female, alert and oriented x 3 today.   HEENT-head normocephalic, atraumatic, sclera clear, conjunctiva pink, hearing intact, trachea midline. Lungs- Clear to ausculation bilaterally, normal work of breathing Heart- irregular rate and rhythm, no murmurs, rubs or gallops  GI- soft, NT, ND, + BS Extremities- no clubbing, cyanosis, or edema MS- no significant deformity or atrophy Skin- no rash or lesion Psych- euthymic mood, full affect Neuro- strength and sensation are intact   Wt Readings from Last 3 Encounters:  06/28/19 85 kg  06/24/19 87.4 kg  06/18/19 87.4 kg    EKG today demonstrates afib HR 124, inc RBBB, QRS 92, QTc 491  TEE 06/24/19 1. Left ventricular ejection fraction, by  estimation, is 55 to 60%. The  left ventricle has normal function. The left ventricle has no regional  wall motion abnormalities.  2. Right ventricular systolic function is mildly reduced. The right  ventricular size is moderately enlarged.  3. Left atrial size was mild to moderately dilated. No left atrial/left  atrial appendage thrombus was detected. The LAA emptying velocity was 42 cm/s.  4. Right atrial size was mildly dilated.  5. Small pericardial effusion. The pericardial effusion is  circumferential. There is no evidence of cardiac tamponade.  6. The mitral valve is degenerative. Mild to moderate mitral valve  regurgitation.  7. Tricuspid valve regurgitation is mild to moderate.  8. The aortic valve is normal in structure. Aortic valve regurgitation is  trivial. No aortic stenosis is present.  9. There is mild (Grade II) atheroma plaque involving the transverse and descending aorta.  10. Evidence of atrial level shunting detected by color flow Doppler.  Agitated saline contrast bubble study was positive with shunting observed within 3-6 cardiac cycles suggestive of interatrial shunt. There is a small patent foramen ovale with  bidirectional shunting across atrial septum.   Conclusion(s)/Recommendation(s): No LA/LAA thrombus identified. Successful  cardioversion performed with restoration of normal sinus rhythm.    Epic records are reviewed at length today  CHA2DS2-VASc Score = 4 The patient's score is based upon: CHF History: No HTN History: Yes Age : 88 + Diabetes History: No Stroke History: No Vascular Disease History: No Gender: Female    ASSESSMENT AND PLAN: 1. Atypical atrial flutter/persistent atrial fibrillation The patient's CHA2DS2-VASc score is 4, indicating a 4.8% annual risk of stroke.   S/p TEE/DCCV on 06/24/19 with ERAF. We discussed therapeutic options today including AAD +/- repeat DCCV. We specifically discussed amiodarone.  Will plan to resume metoprolol 50 mg BID for rate control. Patient and family prefer to hold on starting AAD for now and see how well metoprolol works. Continue Eliquis 5 mg BID with no missed doses for 4 weeks post DCCV. Check TSH today.  2. Secondary Hypercoagulable State (ICD10:  D68.69) The patient is at significant risk for stroke/thromboembolism based upon her CHA2DS2-VASc Score of 4.  Continue Apixaban (Eliquis).   3. Obesity Body mass index is 33.2 kg/m. Lifestyle modification was discussed and encouraged including regular physical activity and weight reduction.  4. HTN Elevated today. Resume BB as above.   5. Diastolic CHF Weight down today. No signs or symptoms of fluid overload.   6. PFO Bubble study positive on TEE for small PFO and bidirectional shunting.  7. MR Mild-mod on TEE.   Will have patient establish with cardiology in [place] as this is much closer for her per her preference.     [facility] [address] [place], [place] [address] [phone] 06/28/2019 12:38 PM

## 2019-06-28 NOTE — Patient Instructions (Addendum)
Restart metoprolol 50mg  twice a day  Referral placed for scheduling of [place] office in next 1-2 weeks.

## 2019-07-10 ENCOUNTER — Other Ambulatory Visit: Payer: Self-pay

## 2019-07-10 ENCOUNTER — Ambulatory Visit (INDEPENDENT_AMBULATORY_CARE_PROVIDER_SITE_OTHER): Payer: Medicare PPO | Admitting: Cardiology

## 2019-07-10 ENCOUNTER — Encounter: Payer: Self-pay | Admitting: Cardiology

## 2019-07-10 VITALS — BP 122/72 | HR 87 | Ht 63.0 in | Wt 184.0 lb

## 2019-07-10 DIAGNOSIS — I4892 Unspecified atrial flutter: Secondary | ICD-10-CM

## 2019-07-10 DIAGNOSIS — I4819 Other persistent atrial fibrillation: Secondary | ICD-10-CM

## 2019-07-10 NOTE — Progress Notes (Signed)
Clinical Summary Michelle Ayala is a 79 y.o.female seen today for follow up. Last seen in afib clinic, this is her firstr visit with [place].    1. 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 Restarted on lopressor 50mg  bid, discussed amio at afib clinic but patient wanted to wait on starting - no recent palpitations. Some generalized fatigue at times - sedentary lifestyle     Past Medical History:  Diagnosis Date  . High cholesterol   . Vertigo      Allergies  Allergen Reactions  . Cinnamon Anaphylaxis  . Shellfish Allergy Anaphylaxis     Current Outpatient Medications  Medication Sig Dispense Refill  . albuterol (PROVENTIL HFA;VENTOLIN HFA) 108 (90 BASE) MCG/ACT inhaler Inhale 1-2 puffs into the lungs every 6 (six) hours as needed for wheezing or shortness of breath.    Marland Kitchen apixaban (ELIQUIS) 5 MG TABS tablet Take 1 tablet (5 mg total) by mouth 2 (two) times daily. 60 tablet 3  . cetirizine (ZYRTEC) 5 MG tablet Take 5 mg by mouth daily.    . Cholecalciferol (VITAMIN D3) 25 MCG (1000 UT) CAPS Take 1 capsule by mouth daily.    Marland Kitchen dimenhyDRINATE (DRAMAMINE) 50 MG tablet Take 100 mg by mouth every 8 (eight) hours as needed for nausea or dizziness.    . ezetimibe-simvastatin (VYTORIN) 10-40 MG tablet Take 1 tablet by mouth at bedtime.    . fluticasone (FLONASE) 50 MCG/ACT nasal spray Place 1 spray into both nostrils daily as needed for allergies or rhinitis.    . furosemide (LASIX) 20 MG tablet Take 1 tablet by mouth daily for the next 3 days then only as needed for weight gain 10 tablet 1  . losartan (COZAAR) 25 MG tablet Take 25 mg by mouth daily.    . meclizine (ANTIVERT) 25 MG tablet Take 1 tablet (25 mg total) by mouth 3 (three) times daily as needed for dizziness. 20 tablet 0  . metoprolol tartrate (LOPRESSOR) 50 MG tablet Take 1 tablet (50 mg total) by  mouth 2 (two) times daily. 60 tablet 3   No current facility-administered medications for this visit.     Past Surgical History:  Procedure Laterality Date  . BREAST SURGERY     cyst removal  . BUBBLE STUDY  06/24/2019   Procedure: BUBBLE STUDY;  Surgeon: Elouise Munroe, MD;  Location: [place];  Service: Cardiovascular;;  . CARDIOVERSION N/A 06/24/2019   Procedure: CARDIOVERSION;  Surgeon: Elouise Munroe, MD;  Location: [facility] ENDOSCOPY;  Service: Cardiovascular;  Laterality: N/A;  . CESAREAN SECTION    . TEE WITHOUT CARDIOVERSION N/A 06/24/2019   Procedure: TRANSESOPHAGEAL ECHOCARDIOGRAM (TEE);  Surgeon: Elouise Munroe, MD;  Location: [facility] ENDOSCOPY;  Service: Cardiovascular;  Laterality: N/A;  . TUBAL LIGATION       Allergies  Allergen Reactions  . Cinnamon Anaphylaxis  . Shellfish Allergy Anaphylaxis      No family history on file.   Social History Michelle Ayala reports that she has never smoked. She has never used smokeless tobacco. Michelle Ayala reports previous alcohol use.   Review of Systems CONSTITUTIONAL: per hpi HEENT: Eyes: No visual loss, blurred vision, double vision or yellow sclerae.No hearing loss, sneezing, congestion, runny nose or sore throat.  SKIN: No rash or itching.  CARDIOVASCULAR: per hpi RESPIRATORY: No shortness of breath, cough or sputum.  GASTROINTESTINAL: No anorexia,  nausea, vomiting or diarrhea. No abdominal pain or blood.  GENITOURINARY: No burning on urination, no polyuria NEUROLOGICAL: No headache, dizziness, syncope, paralysis, ataxia, numbness or tingling in the extremities. No change in bowel or bladder control.  MUSCULOSKELETAL: No muscle, back pain, joint pain or stiffness.  LYMPHATICS: No enlarged nodes. No history of splenectomy.  PSYCHIATRIC: No history of depression or anxiety.  ENDOCRINOLOGIC: No reports of sweating, cold or heat intolerance. No polyuria or polydipsia.  Marland Kitchen   Physical Examination Today's Vitals    07/10/19 1301  BP: 122/72  Pulse: 87  SpO2: 97%  Weight: 184 lb (83.5 kg)  Height: 5\' 3"  (1.6 m)   Body mass index is 32.59 kg/m.  Gen: resting comfortably, no acute distress HEENT: no scleral icterus, pupils equal round and reactive, no palptable cervical adenopathy,  CV: irreg, 2/6 systolic murmur apex, no jvd Resp: Clear to auscultation bilaterally GI: abdomen is soft, non-tender, non-distended, normal bowel sounds, no hepatosplenomegaly MSK: extremities are warm, no edema.  Skin: warm, no rash Neuro:  no focal deficits Psych: appropriate affect   Diagnostic Studies  TEE 06/24/19 1. Left ventricular ejection fraction, by estimation, is 55 to 60%. The  left ventricle has normal function. The left ventricle has no regional  wall motion abnormalities.  2. Right ventricular systolic function is mildly reduced. The right  ventricular size is moderately enlarged.  3. Left atrial size was mild to moderately dilated. No left atrial/left  atrial appendage thrombus was detected. The LAA emptying velocity was 42 cm/s.  4. Right atrial size was mildly dilated.  5. Small pericardial effusion. The pericardial effusion is  circumferential. There is no evidence of cardiac tamponade.  6. The mitral valve is degenerative. Mild to moderate mitral valve  regurgitation.  7. Tricuspid valve regurgitation is mild to moderate.  8. The aortic valve is normal in structure. Aortic valve regurgitation is  trivial. No aortic stenosis is present.  9. There is mild (Grade II) atheroma plaque involving the transverse and descending aorta.  10. Evidence of atrial level shunting detected by color flow Doppler.  Agitated saline contrast bubble study was positive with shunting observed within 3-6 cardiac cycles suggestive of interatrial shunt. 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   Conclusion(s)/Recommendation(s): No LA/LAA thrombus identified.  Successful  cardioversion performed with restoration of normal sinus rhythm.    Assessment and Plan  1. Afib/aflutter - rate controlled afib today by ekg - difficult to assess if her genralized fatigue at times if arrhytmia related - she is hesitant to consider antiarrhythmics at this time.  - continue rate control. Sedentary lifestyle, encouraged increased activity, this may help with her genraelized fatigue.       06/26/19, M.D.

## 2019-07-10 NOTE — Patient Instructions (Signed)
Your physician recommends that you schedule a follow-up appointment in: 3 MONTHS WITH DR BRANCH  Your physician recommends that you continue on your current medications as directed. Please refer to the Current Medication list given to you today.  Thank you for choosing [facility]!!    

## 2019-07-11 ENCOUNTER — Telehealth: Payer: Self-pay | Admitting: Cardiology

## 2019-07-11 NOTE — Telephone Encounter (Signed)
Would like to know when she can take the covid vaccine

## 2019-07-12 NOTE — Telephone Encounter (Signed)
I would recommend going ahead and getting the vaccine. I see no reason she cannot. I think may be they had said that due to some of the symptoms she was having at the time, but her symptoms are stable and not progressing and not contraindication to getting the vaccine and significant risk if she were to get covid, mortality rate at age 79 is close to 10% from the virus.      Dominga Ferry MD

## 2019-07-12 NOTE — Telephone Encounter (Signed)
Patient returned call.  Please call [phone]

## 2019-07-12 NOTE — Telephone Encounter (Signed)
Patient informed and verbalized understanding of plan. 

## 2019-07-12 NOTE — Telephone Encounter (Signed)
Patient says she was told by a-fib clinic provider that she should wait on taking the covid vaccine. Please advise.

## 2019-08-22 ENCOUNTER — Telehealth: Payer: Self-pay | Admitting: Cardiology

## 2019-08-22 NOTE — Telephone Encounter (Signed)
I spoke with daughter and told her mother could take vitamin D3

## 2019-08-22 NOTE — Telephone Encounter (Signed)
Pt's daughter -Selena Batten called wanting to know if it's ok for her mother to take 1000 units of Vitamin D3 with her Eliquis and Metoprolol Tartrate   Please call [phone]

## 2019-09-13 ENCOUNTER — Other Ambulatory Visit: Payer: Self-pay | Admitting: *Deleted

## 2019-09-13 MED ORDER — METOPROLOL TARTRATE 50 MG PO TABS: 50.0000 mg | ORAL_TABLET | Freq: Two times a day (BID) | ORAL | 1 refills | Status: DC

## 2019-09-17 ENCOUNTER — Other Ambulatory Visit (HOSPITAL_COMMUNITY): Payer: Self-pay | Admitting: Family Medicine

## 2019-09-17 ENCOUNTER — Other Ambulatory Visit: Payer: Self-pay | Admitting: Family Medicine

## 2019-09-17 DIAGNOSIS — R7401 Elevation of levels of liver transaminase levels: Secondary | ICD-10-CM

## 2019-09-26 ENCOUNTER — Other Ambulatory Visit: Payer: Self-pay

## 2019-09-26 ENCOUNTER — Ambulatory Visit (HOSPITAL_COMMUNITY)
Admission: RE | Admit: 2019-09-26 | Discharge: 2019-09-26 | Disposition: A | Discharge status: Home / Self Care | Payer: Medicare PPO | Source: Ambulatory Visit | Attending: Family Medicine | Admitting: Family Medicine

## 2019-09-26 DIAGNOSIS — R7401 Elevation of levels of liver transaminase levels: Secondary | ICD-10-CM | POA: Insufficient documentation

## 2019-10-11 ENCOUNTER — Ambulatory Visit: Payer: Medicare PPO | Admitting: Cardiology

## 2019-10-15 ENCOUNTER — Ambulatory Visit: Payer: Medicare PPO | Admitting: Cardiology

## 2019-10-15 ENCOUNTER — Encounter: Payer: Self-pay | Admitting: *Deleted

## 2019-10-15 ENCOUNTER — Encounter: Payer: Self-pay | Admitting: Cardiology

## 2019-10-15 VITALS — BP 120/72 | HR 76 | Ht 63.0 in | Wt 179.0 lb

## 2019-10-15 DIAGNOSIS — I4819 Other persistent atrial fibrillation: Secondary | ICD-10-CM

## 2019-10-15 MED ORDER — METOPROLOL TARTRATE 50 MG PO TABS: 50.0000 mg | ORAL_TABLET | Freq: Two times a day (BID) | ORAL | 1 refills | Status: DC

## 2019-10-15 MED ORDER — APIXABAN 5 MG PO TABS: 5.0000 mg | ORAL_TABLET | Freq: Two times a day (BID) | ORAL | 1 refills | Status: DC

## 2019-10-15 NOTE — Progress Notes (Signed)
Clinical Summary Michelle Ayala is a 79 y.o.female seen today for follow upof the following medical problems.   1. 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 Restarted on lopressor 50mg  bid, discussed amio at afib clinic but patient wanted to wait on starting    - no recent palpitations - coompliant with meds.       Past Medical History:  Diagnosis Date  . High cholesterol   . Vertigo      Allergies  Allergen Reactions  . Cinnamon Anaphylaxis  . Shellfish Allergy Anaphylaxis  . Poison Ivy Extract     Rash   . Poison Oak Extract     Rash      Current Outpatient Medications  Medication Sig Dispense Refill  . apixaban (ELIQUIS) 5 MG TABS tablet Take 1 tablet (5 mg total) by mouth 2 (two) times daily. 60 tablet 3  . ezetimibe-simvastatin (VYTORIN) 10-40 MG tablet Take 1 tablet by mouth at bedtime.    . meclizine (ANTIVERT) 25 MG tablet Take 1 tablet (25 mg total) by mouth 3 (three) times daily as needed for dizziness. 20 tablet 0  . metoprolol tartrate (LOPRESSOR) 50 MG tablet Take 1 tablet (50 mg total) by mouth 2 (two) times daily. 180 tablet 1   No current facility-administered medications for this visit.     Past Surgical History:  Procedure Laterality Date  . BREAST SURGERY     cyst removal  . BUBBLE STUDY  06/24/2019   Procedure: BUBBLE STUDY;  Surgeon: 06/26/2019, MD;  Location: [facility] ENDOSCOPY;  Service: Cardiovascular;;  . CARDIOVERSION N/A 06/24/2019   Procedure: CARDIOVERSION;  Surgeon: 06/26/2019, MD;  Location: [facility] ENDOSCOPY;  Service: Cardiovascular;  Laterality: N/A;  . CESAREAN SECTION    . TEE WITHOUT CARDIOVERSION N/A 06/24/2019   Procedure: TRANSESOPHAGEAL ECHOCARDIOGRAM (TEE);  Surgeon: 06/26/2019, MD;  Location: [facility] ENDOSCOPY;  Service: Cardiovascular;  Laterality: N/A;  . TUBAL LIGATION        Allergies  Allergen Reactions  . Cinnamon Anaphylaxis  . Shellfish Allergy Anaphylaxis  . Poison Ivy Extract     Rash   . Poison Oak Extract     Rash       No family history on file.   Social History Michelle Ayala reports that she has never smoked. She has never used smokeless tobacco. Michelle Ayala reports previous alcohol use.   Review of Systems CONSTITUTIONAL: No weight loss, fever, chills, weakness or fatigue.  HEENT: Eyes: No visual loss, blurred vision, double vision or yellow sclerae.No hearing loss, sneezing, congestion, runny nose or sore throat.  SKIN: No rash or itching.  CARDIOVASCULAR: per hpi RESPIRATORY: No shortness of breath, cough or sputum.  GASTROINTESTINAL: No anorexia, nausea, vomiting or diarrhea. No abdominal pain or blood.  GENITOURINARY: No burning on urination, no polyuria NEUROLOGICAL: No headache, dizziness, syncope, paralysis, ataxia, numbness or tingling in the extremities. No change in bowel or bladder control.  MUSCULOSKELETAL: No muscle, back pain, joint pain or stiffness.  LYMPHATICS: No enlarged nodes. No history of splenectomy.  PSYCHIATRIC: No history of depression or anxiety.  ENDOCRINOLOGIC: No reports of sweating, cold or heat intolerance. No polyuria or polydipsia.  Michelle Ayala   Physical Examination Today's Vitals   10/15/19 0939  BP: 120/72  Pulse: 76  SpO2: 98%  Weight: 179 lb (81.2 kg)  Height: 5\' 3"  (1.6 m)  Body mass index is 31.71 kg/m.  Gen: resting comfortably, no acute distress HEENT: no scleral icterus, pupils equal round and reactive, no palptable cervical adenopathy,  CV: irreg, no m/r/g, no jvd Resp: Clear to auscultation bilaterally GI: abdomen is soft, non-tender, non-distended, normal bowel sounds, no hepatosplenomegaly MSK: extremities are warm, no edema.  Skin: warm, no rash Neuro:  no focal deficits Psych: appropriate affect   Diagnostic Studies TEE 06/24/19 1. Left ventricular ejection fraction, by  estimation, is 55 to 60%. The  left ventricle has normal function. The left ventricle has no regional  wall motion abnormalities.  2. Right ventricular systolic function is mildly reduced. The right  ventricular size is moderately enlarged.  3. Left atrial size was mild to moderately dilated. No left atrial/left  atrial appendage thrombus was detected. The LAA emptying velocity was 42 cm/s.  4. Right atrial size was mildly dilated.  5. Small pericardial effusion. The pericardial effusion is  circumferential. There is no evidence of cardiac tamponade.  6. The mitral valve is degenerative. Mild to moderate mitral valve  regurgitation.  7. Tricuspid valve regurgitation is mild to moderate.  8. The aortic valve is normal in structure. Aortic valve regurgitation is  trivial. No aortic stenosis is present.  9. There is mild (Grade II) atheroma plaque involving the transverse and descending aorta.  10. Evidence of atrial level shunting detected by color flow Doppler.  Agitated saline contrast bubble study was positive with shunting observed within 3-6 cardiac cycles suggestive of interatrial shunt. 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   Conclusion(s)/Recommendation(s): No LA/LAA thrombus identified. Successful  cardioversion performed with restoration of normal sinus rhythm.     Assessment and Plan  1. Afib/aflutter -no recent symptoms - continue current meds.      Michelle Ayala, M.D.

## 2019-10-15 NOTE — Patient Instructions (Signed)

## 2019-12-31 ENCOUNTER — Telehealth: Payer: Self-pay | Admitting: Cardiology

## 2019-12-31 NOTE — Telephone Encounter (Signed)
Per phone call from pt's daughter- Michelle Ayala-- pt had a spell of Vertigo this morning and her PCP, Dr. Janeece Riggers wrote her a Rx of Zofran to take for the nausea.  Wanted to make sure it was safe for her to take w/ her other heart medications since she has Afib   Please call [phone]

## 2019-12-31 NOTE — Telephone Encounter (Signed)
Patient informed and verbalized understanding of plan. 

## 2019-12-31 NOTE — Telephone Encounter (Signed)
Ok to take Zofran as needed for nausea. No interactions with her other meds and QTc was normal on most recent EKG.

## 2020-02-06 ENCOUNTER — Telehealth: Payer: Self-pay | Admitting: Cardiology

## 2020-02-06 NOTE — Telephone Encounter (Signed)
New message     *STAT* If patient is at the pharmacy, call can be transferred to refill team.   1. Which medications need to be refilled? (please list name of each medication and dose if known) metoprolol tartrate (LOPRESSOR) 50 MG tablet(Expired) apixaban (ELIQUIS) 5 MG TABS tablet 2. Which pharmacy/location (including street and city if local pharmacy) is medication to be sent to? [facility] [place]   3. Do they need a 30 day or 90 day supply? 90

## 2020-02-07 MED ORDER — APIXABAN 5 MG PO TABS: 5.0000 mg | ORAL_TABLET | Freq: Two times a day (BID) | ORAL | 1 refills | Status: DC

## 2020-02-07 MED ORDER — METOPROLOL TARTRATE 50 MG PO TABS: 50.0000 mg | ORAL_TABLET | Freq: Two times a day (BID) | ORAL | 1 refills | Status: DC

## 2020-02-07 NOTE — Addendum Note (Signed)
Addended by: Burman Nieves T on: 02/07/2020 07:42 AM   Modules accepted: Orders

## 2020-02-07 NOTE — Telephone Encounter (Signed)
Medication sent to pharmacy  

## 2020-04-22 ENCOUNTER — Encounter: Payer: Self-pay | Admitting: *Deleted

## 2020-04-22 ENCOUNTER — Encounter: Payer: Self-pay | Admitting: Cardiology

## 2020-04-22 ENCOUNTER — Ambulatory Visit: Payer: Medicare PPO | Admitting: Cardiology

## 2020-04-22 VITALS — BP 130/90 | HR 74 | Ht 63.0 in | Wt 192.8 lb

## 2020-04-22 DIAGNOSIS — I4819 Other persistent atrial fibrillation: Secondary | ICD-10-CM | POA: Diagnosis not present

## 2020-04-22 MED ORDER — APIXABAN 5 MG PO TABS: 5.0000 mg | ORAL_TABLET | Freq: Two times a day (BID) | ORAL | 1 refills | Status: DC

## 2020-04-22 MED ORDER — METOPROLOL TARTRATE 50 MG PO TABS: 50.0000 mg | ORAL_TABLET | Freq: Two times a day (BID) | ORAL | 1 refills | Status: DC

## 2020-04-22 NOTE — Progress Notes (Signed)
Clinical Summary Ms. Hohler is a 80 y.o.female seen today for follow upof the following medical problems.   1. 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 Restarted on lopressor 50mg  bid, discussed amio at afib clinic but patient wanted to wait on starting   - isolated episode of fluttering 2 weeks ago, lasted about 1 minute - compliant with meds.  - no bleeding on eliquis  Past Medical History:  Diagnosis Date  . High cholesterol   . Vertigo      Allergies  Allergen Reactions  . Cinnamon Anaphylaxis  . Shellfish Allergy Anaphylaxis  . Poison Ivy Extract     Rash   . Poison Oak Extract     Rash      Current Outpatient Medications  Medication Sig Dispense Refill  . apixaban (ELIQUIS) 5 MG TABS tablet Take 1 tablet (5 mg total) by mouth 2 (two) times daily. 180 tablet 1  . meclizine (ANTIVERT) 25 MG tablet Take 1 tablet (25 mg total) by mouth 3 (three) times daily as needed for dizziness. 20 tablet 0  . metoprolol tartrate (LOPRESSOR) 50 MG tablet Take 1 tablet (50 mg total) by mouth 2 (two) times daily. 180 tablet 1   No current facility-administered medications for this visit.     Past Surgical History:  Procedure Laterality Date  . BREAST SURGERY     cyst removal  . BUBBLE STUDY  06/24/2019   Procedure: BUBBLE STUDY;  Surgeon: 06/26/2019, MD;  Location: [facility] ENDOSCOPY;  Service: Cardiovascular;;  . CARDIOVERSION N/A 06/24/2019   Procedure: CARDIOVERSION;  Surgeon: 06/26/2019, MD;  Location: [facility] ENDOSCOPY;  Service: Cardiovascular;  Laterality: N/A;  . CESAREAN SECTION    . TEE WITHOUT CARDIOVERSION N/A 06/24/2019   Procedure: TRANSESOPHAGEAL ECHOCARDIOGRAM (TEE);  Surgeon: 06/26/2019, MD;  Location: [facility] ENDOSCOPY;  Service: Cardiovascular;  Laterality: N/A;  . TUBAL LIGATION       Allergies  Allergen Reactions   . Cinnamon Anaphylaxis  . Shellfish Allergy Anaphylaxis  . Poison Ivy Extract     Rash   . Poison Oak Extract     Rash       No family history on file.   Social History Ms. Colclasure reports that she has never smoked. She has never used smokeless tobacco. Ms. Kizer reports previous alcohol use.   Review of Systems CONSTITUTIONAL: No weight loss, fever, chills, weakness or fatigue.  HEENT: Eyes: No visual loss, blurred vision, double vision or yellow sclerae.No hearing loss, sneezing, congestion, runny nose or sore throat.  SKIN: No rash or itching.  CARDIOVASCULAR: per hpi RESPIRATORY: No shortness of breath, cough or sputum.  GASTROINTESTINAL: No anorexia, nausea, vomiting or diarrhea. No abdominal pain or blood.  GENITOURINARY: No burning on urination, no polyuria NEUROLOGICAL: No headache, dizziness, syncope, paralysis, ataxia, numbness or tingling in the extremities. No change in bowel or bladder control.  MUSCULOSKELETAL: No muscle, back pain, joint pain or stiffness.  LYMPHATICS: No enlarged nodes. No history of splenectomy.  PSYCHIATRIC: No history of depression or anxiety.  ENDOCRINOLOGIC: No reports of sweating, cold or heat intolerance. No polyuria or polydipsia.  Archie Patten   Physical Examination Today's Vitals   04/22/20 0949  BP: 130/90  Pulse: 74  SpO2: 99%  Weight: 192 lb 12.8 oz (87.5 kg)  Height: 5\' 3"  (1.6 m)   Body mass index is  34.15 kg/m.  Gen: resting comfortably, no acute distress HEENT: no scleral icterus, pupils equal round and reactive, no palptable cervical adenopathy,  CV: irreg, no mr/g, no jvd Resp: Clear to auscultation bilaterally GI: abdomen is soft, non-tender, non-distended, normal bowel sounds, no hepatosplenomegaly MSK: extremities are warm, no edema.  Skin: warm, no rash Neuro:  no focal deficits Psych: appropriate affect   Diagnostic Studies TEE 06/24/19 1. Left ventricular ejection fraction, by estimation, is 55 to 60%. The   left ventricle has normal function. The left ventricle has no regional  wall motion abnormalities.  2. Right ventricular systolic function is mildly reduced. The right  ventricular size is moderately enlarged.  3. Left atrial size was mild to moderately dilated. No left atrial/left  atrial appendage thrombus was detected. The LAA emptying velocity was 42 cm/s.  4. Right atrial size was mildly dilated.  5. Small pericardial effusion. The pericardial effusion is  circumferential. There is no evidence of cardiac tamponade.  6. The mitral valve is degenerative. Mild to moderate mitral valve  regurgitation.  7. Tricuspid valve regurgitation is mild to moderate.  8. The aortic valve is normal in structure. Aortic valve regurgitation is  trivial. No aortic stenosis is present.  9. There is mild (Grade II) atheroma plaque involving the transverse and descending aorta.  10. Evidence of atrial level shunting detected by color flow Doppler.  Agitated saline contrast bubble study was positive with shunting observed within 3-6 cardiac cycles suggestive of interatrial shunt. 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   Conclusion(s)/Recommendation(s): No LA/LAA thrombus identified. Successful  cardioversion performed with restoration of normal sinus rhythm.       Assessment and Plan  1. Afib/aflutter - overall doing well with rate control, EKG today shows rate controlled afib - cotninue current meds including anticoag      Antoine Poche, M.D

## 2020-04-22 NOTE — Patient Instructions (Signed)

## 2020-08-01 ENCOUNTER — Emergency Department (HOSPITAL_COMMUNITY): Payer: Medicare PPO

## 2020-08-01 ENCOUNTER — Encounter (HOSPITAL_COMMUNITY): Payer: Self-pay | Admitting: Family Medicine

## 2020-08-01 ENCOUNTER — Other Ambulatory Visit: Payer: Self-pay

## 2020-08-01 ENCOUNTER — Observation Stay (HOSPITAL_COMMUNITY)
Admission: EM | Admit: 2020-08-01 | Discharge: 2020-08-02 | Disposition: A | Discharge status: Home / Self Care | Payer: Medicare PPO | Attending: Family Medicine | Admitting: Family Medicine

## 2020-08-01 DIAGNOSIS — D6869 Other thrombophilia: Secondary | ICD-10-CM | POA: Insufficient documentation

## 2020-08-01 DIAGNOSIS — E872 Acidosis, unspecified: Secondary | ICD-10-CM

## 2020-08-01 DIAGNOSIS — D72829 Elevated white blood cell count, unspecified: Secondary | ICD-10-CM | POA: Diagnosis present

## 2020-08-01 DIAGNOSIS — R109 Unspecified abdominal pain: Secondary | ICD-10-CM | POA: Diagnosis present

## 2020-08-01 DIAGNOSIS — Z20822 Contact with and (suspected) exposure to covid-19: Secondary | ICD-10-CM | POA: Diagnosis not present

## 2020-08-01 DIAGNOSIS — N200 Calculus of kidney: Secondary | ICD-10-CM

## 2020-08-01 DIAGNOSIS — I10 Essential (primary) hypertension: Secondary | ICD-10-CM | POA: Insufficient documentation

## 2020-08-01 DIAGNOSIS — I4819 Other persistent atrial fibrillation: Secondary | ICD-10-CM | POA: Diagnosis not present

## 2020-08-01 DIAGNOSIS — N390 Urinary tract infection, site not specified: Secondary | ICD-10-CM | POA: Diagnosis not present

## 2020-08-01 DIAGNOSIS — Z79899 Other long term (current) drug therapy: Secondary | ICD-10-CM | POA: Insufficient documentation

## 2020-08-01 DIAGNOSIS — B9689 Other specified bacterial agents as the cause of diseases classified elsewhere: Secondary | ICD-10-CM | POA: Insufficient documentation

## 2020-08-01 DIAGNOSIS — N201 Calculus of ureter: Secondary | ICD-10-CM | POA: Diagnosis not present

## 2020-08-01 DIAGNOSIS — N1330 Unspecified hydronephrosis: Secondary | ICD-10-CM | POA: Diagnosis not present

## 2020-08-01 DIAGNOSIS — R1031 Right lower quadrant pain: Secondary | ICD-10-CM

## 2020-08-01 DIAGNOSIS — Z7901 Long term (current) use of anticoagulants: Secondary | ICD-10-CM | POA: Diagnosis not present

## 2020-08-01 DIAGNOSIS — N3001 Acute cystitis with hematuria: Secondary | ICD-10-CM

## 2020-08-01 LAB — CBC WITH DIFFERENTIAL/PLATELET
Abs Immature Granulocytes: 0.07 10*3/uL (ref 0.00–0.07)
Basophils Absolute: 0 10*3/uL (ref 0.0–0.1)
Basophils Relative: 0 %
Eosinophils Absolute: 0.2 10*3/uL (ref 0.0–0.5)
Eosinophils Relative: 2 %
HCT: 39 % (ref 36.0–46.0)
Hemoglobin: 12.4 g/dL (ref 12.0–15.0)
Immature Granulocytes: 1 %
Lymphocytes Relative: 9 %
Lymphs Abs: 1.4 10*3/uL (ref 0.7–4.0)
MCH: 31.2 pg (ref 26.0–34.0)
MCHC: 31.8 g/dL (ref 30.0–36.0)
MCV: 98 fL (ref 80.0–100.0)
Monocytes Absolute: 0.6 10*3/uL (ref 0.1–1.0)
Monocytes Relative: 4 %
Neutro Abs: 12.1 10*3/uL — ABNORMAL HIGH (ref 1.7–7.7)
Neutrophils Relative %: 84 %
Platelets: 189 10*3/uL (ref 150–400)
RBC: 3.98 MIL/uL (ref 3.87–5.11)
RDW: 13.2 % (ref 11.5–15.5)
WBC: 14.4 10*3/uL — ABNORMAL HIGH (ref 4.0–10.5)
nRBC: 0 % (ref 0.0–0.2)

## 2020-08-01 LAB — URINALYSIS, ROUTINE W REFLEX MICROSCOPIC
Bilirubin Urine: NEGATIVE
Glucose, UA: 50 mg/dL — AB
Ketones, ur: NEGATIVE mg/dL
Leukocytes,Ua: NEGATIVE
Nitrite: NEGATIVE
Protein, ur: 100 mg/dL — AB
RBC / HPF: >50 RBC/hpf — ABNORMAL HIGH (ref 0–5)
Specific Gravity, Urine: 1.016 (ref 1.005–1.030)
pH: 5 (ref 5.0–8.0)

## 2020-08-01 LAB — COMPREHENSIVE METABOLIC PANEL
ALT: 26 U/L (ref 0–44)
AST: 32 U/L (ref 15–41)
Albumin: 4.3 g/dL (ref 3.5–5.0)
Alkaline Phosphatase: 57 U/L (ref 38–126)
Anion gap: 11 (ref 5–15)
BUN: 17 mg/dL (ref 8–23)
CO2: 26 mmol/L (ref 22–32)
Calcium: 9.5 mg/dL (ref 8.9–10.3)
Chloride: 102 mmol/L (ref 98–111)
Creatinine, Ser: 1.19 mg/dL — ABNORMAL HIGH (ref 0.44–1.00)
GFR, Estimated: 46 mL/min — ABNORMAL LOW (ref >60)
Glucose, Bld: 171 mg/dL — ABNORMAL HIGH (ref 70–99)
Potassium: 3.8 mmol/L (ref 3.5–5.1)
Sodium: 139 mmol/L (ref 135–145)
Total Bilirubin: 0.8 mg/dL (ref 0.3–1.2)
Total Protein: 7.7 g/dL (ref 6.5–8.1)

## 2020-08-01 LAB — LACTIC ACID, PLASMA
Lactic Acid, Venous: 3.4 mmol/L (ref 0.5–1.9)
Lactic Acid, Venous: 3.3 mmol/L (ref 0.5–1.9)
Lactic Acid, Venous: 2.9 mmol/L (ref 0.5–1.9)

## 2020-08-01 LAB — LIPASE, BLOOD: Lipase: 25 U/L (ref 11–51)

## 2020-08-01 MED ORDER — LACTATED RINGERS IV BOLUS
250.0000 mL | Freq: Once | INTRAVENOUS | Status: AC
  Administered 2020-08-01: 250 mL via INTRAVENOUS

## 2020-08-01 MED ORDER — METOPROLOL TARTRATE 50 MG PO TABS
50.0000 mg | ORAL_TABLET | Freq: Two times a day (BID) | ORAL | Status: DC
  Administered 2020-08-01 – 2020-08-02 (×3): 50 mg via ORAL
  Filled 2020-08-01 (×3): qty 1

## 2020-08-01 MED ORDER — METRONIDAZOLE 500 MG/100ML IV SOLN
500.0000 mg | Freq: Once | INTRAVENOUS | Status: AC
  Administered 2020-08-01: 500 mg via INTRAVENOUS
  Filled 2020-08-01: qty 100

## 2020-08-01 MED ORDER — EZETIMIBE-SIMVASTATIN 10-40 MG PO TABS: 1.0000 | ORAL_TABLET | Freq: Every day | ORAL | Status: DC

## 2020-08-01 MED ORDER — ONDANSETRON HCL 4 MG/2ML IJ SOLN: 4.0000 mg | Freq: Four times a day (QID) | INTRAMUSCULAR | Status: DC | PRN

## 2020-08-01 MED ORDER — SODIUM CHLORIDE 0.9 % IV BOLUS
500.0000 mL | Freq: Once | INTRAVENOUS | Status: AC
  Administered 2020-08-01: 500 mL via INTRAVENOUS

## 2020-08-01 MED ORDER — ACETAMINOPHEN 650 MG RE SUPP: 650.0000 mg | Freq: Four times a day (QID) | RECTAL | Status: DC | PRN

## 2020-08-01 MED ORDER — ONDANSETRON HCL 4 MG/2ML IJ SOLN
4.0000 mg | Freq: Once | INTRAMUSCULAR | Status: AC
  Administered 2020-08-01: 4 mg via INTRAVENOUS
  Filled 2020-08-01: qty 2

## 2020-08-01 MED ORDER — LACTATED RINGERS IV SOLN: INTRAVENOUS | Status: DC

## 2020-08-01 MED ORDER — SODIUM CHLORIDE 0.9 % IV SOLN
2.0000 g | INTRAVENOUS | Status: DC
  Administered 2020-08-02: 2 g via INTRAVENOUS
  Filled 2020-08-01: qty 20

## 2020-08-01 MED ORDER — APIXABAN 5 MG PO TABS
5.0000 mg | ORAL_TABLET | Freq: Two times a day (BID) | ORAL | Status: DC
  Administered 2020-08-01 – 2020-08-02 (×3): 5 mg via ORAL
  Filled 2020-08-01 (×3): qty 1

## 2020-08-01 MED ORDER — ACETAMINOPHEN 325 MG PO TABS: 650.0000 mg | ORAL_TABLET | Freq: Four times a day (QID) | ORAL | Status: DC | PRN

## 2020-08-01 MED ORDER — TRAZODONE HCL 50 MG PO TABS: 25.0000 mg | ORAL_TABLET | Freq: Every evening | ORAL | Status: DC | PRN

## 2020-08-01 MED ORDER — SIMVASTATIN 20 MG PO TABS
40.0000 mg | ORAL_TABLET | Freq: Every day | ORAL | Status: DC
  Administered 2020-08-01: 40 mg via ORAL
  Filled 2020-08-01: qty 2

## 2020-08-01 MED ORDER — SODIUM CHLORIDE 0.9 % IV SOLN
2.0000 g | Freq: Once | INTRAVENOUS | Status: AC
  Administered 2020-08-01: 2 g via INTRAVENOUS
  Filled 2020-08-01: qty 20

## 2020-08-01 MED ORDER — FENTANYL CITRATE (PF) 100 MCG/2ML IJ SOLN
25.0000 ug | Freq: Once | INTRAMUSCULAR | Status: AC
  Administered 2020-08-01: 25 ug via INTRAVENOUS
  Filled 2020-08-01: qty 2

## 2020-08-01 MED ORDER — ONDANSETRON HCL 4 MG PO TABS: 4.0000 mg | ORAL_TABLET | Freq: Four times a day (QID) | ORAL | Status: DC | PRN

## 2020-08-01 MED ORDER — HYDROCODONE-ACETAMINOPHEN 5-325 MG PO TABS: 1.0000 | ORAL_TABLET | Freq: Four times a day (QID) | ORAL | Status: DC | PRN

## 2020-08-01 MED ORDER — VITAMIN D 25 MCG (1000 UNIT) PO TABS
1000.0000 [IU] | ORAL_TABLET | Freq: Every day | ORAL | Status: DC
  Administered 2020-08-01 – 2020-08-02 (×2): 1000 [IU] via ORAL
  Filled 2020-08-01 (×2): qty 1

## 2020-08-01 MED ORDER — BISACODYL 5 MG PO TBEC: 5.0000 mg | DELAYED_RELEASE_TABLET | Freq: Every day | ORAL | Status: DC | PRN

## 2020-08-01 MED ORDER — EZETIMIBE 10 MG PO TABS
10.0000 mg | ORAL_TABLET | Freq: Every day | ORAL | Status: DC
  Administered 2020-08-01: 10 mg via ORAL
  Filled 2020-08-01: qty 1

## 2020-08-01 NOTE — ED Notes (Signed)
Date and time results received: 08/01/20 0628  Test: lactic acid Critical Value: 3.4 Name of Provider Notified:Dr. S. Rancour Orders Received? Or Actions Taken? A/w orders

## 2020-08-01 NOTE — ED Provider Notes (Signed)
Blood pressure 138/84, pulse (!) 101, temperature 98.4 F (36.9 C), temperature source Oral, resp. rate 20, height 5\' 3"  (1.6 m), weight 86.6 kg, SpO2 98 %.  Assuming care from Dr. .  In short, Michelle Ayala is a 80 y.o. female with a chief complaint of Abdominal Pain .  Refer to the original H&P for additional details.  The current plan of care is to f/u on CT abdomen/pelvis and reassess.  08:40 AM  CT imaging reviewed showing some perinephric stranding and hydronephrosis but stone in the bladder appearing to have passed.  No further obstruction noted.  UA is pending.  Patient second lactic acid is also pending.  On reevaluation the patient is very well-appearing.  She is awake and alert.  She states she is feeling much better and pain has diminished significantly consistent with suspected recently passed ureteral stone now in the bladder.  Plan to follow second lactic acid.    Discussed patient's case with [facility] to request admission. Patient and family (if present) updated with plan. Care transferred to [facility] service.  I reviewed all nursing notes, vitals, pertinent old records, EKGs, labs, imaging (as available).     [facility], MD 08/02/20 [phone]

## 2020-08-01 NOTE — ED Provider Notes (Signed)
[facility] EMERGENCY DEPARTMENT Provider Note   CSN: [id number] Arrival date & time: 08/01/20  2671     History Chief Complaint  Patient presents with  . Abdominal Pain    Michelle Ayala is a 80 y.o. female.  Patient presents via EMS with right-sided abdominal pain with nausea and vomiting.  Symptoms started acutely about 9 PM last night.  Pain is in her right lower abdomen and constant.  Associated with 5 or 6 episodes of nonbloody nonbilious emesis.  States she was in the bathroom for several hours.  Had 1 episode of diarrhea.  Denies fever but has had chills.  Has not checked her temperature at home.  No pain with urination or blood in the urine.  No vaginal bleeding or discharge.  No chest pain or shortness of breath.  Still has appendix and gallbladder.  Still has uterus and ovaries. Takes Eliquis for persistent atrial fibrillation.  The history is provided by the patient and the EMS personnel.  Abdominal Pain Associated symptoms: diarrhea, nausea and vomiting   Associated symptoms: no cough, no dysuria, no fever, no hematuria and no shortness of breath        Past Medical History:  Diagnosis Date  . High cholesterol   . Vertigo     Patient Active Problem List   Diagnosis Date Noted  . Persistent atrial fibrillation (HCC) 06/18/2019  . Secondary hypercoagulable state (HCC) 06/13/2019  . Atypical atrial flutter [facility])     Past Surgical History:  Procedure Laterality Date  . BREAST SURGERY     cyst removal  . BUBBLE STUDY  06/24/2019   Procedure: BUBBLE STUDY;  Surgeon: Parke Poisson, MD;  Location: [facility] ENDOSCOPY;  Service: Cardiovascular;;  . CARDIOVERSION N/A 06/24/2019   Procedure: CARDIOVERSION;  Surgeon: Parke Poisson, MD;  Location: [facility] ENDOSCOPY;  Service: Cardiovascular;  Laterality: N/A;  . CESAREAN SECTION    . TEE WITHOUT CARDIOVERSION N/A 06/24/2019   Procedure: TRANSESOPHAGEAL ECHOCARDIOGRAM (TEE);  Surgeon: Parke Poisson, MD;  Location: [facility]  ENDOSCOPY;  Service: Cardiovascular;  Laterality: N/A;  . TUBAL LIGATION       OB History   No obstetric history on file.     No family history on file.  Social History   Tobacco Use  . Smoking status: Never Smoker  . Smokeless tobacco: Never Used  Substance Use Topics  . Alcohol use: Not Currently    Comment: occ  . Drug use: No    Home Medications Prior to Admission medications   Medication Sig Start Date End Date Taking? Authorizing Provider  acetaminophen (TYLENOL) 500 MG tablet Take 500 mg by mouth every 6 (six) hours as needed.    [provider]  apixaban (ELIQUIS) 5 MG TABS tablet Take 1 tablet (5 mg total) by mouth 2 (two) times daily. 04/22/20   Antoine Poche, MD  cholecalciferol (VITAMIN D) 25 MCG (1000 UNIT) tablet Take 1,000 Units by mouth daily. 03/30/20   [provider]  ezetimibe-simvastatin (VYTORIN) 10-40 MG tablet Take 1 tablet by mouth daily. 01/24/20   [provider]  meclizine (ANTIVERT) 25 MG tablet Take 1 tablet (25 mg total) by mouth 3 (three) times daily as needed for dizziness. 06/29/13   Donnetta Hutching, MD  metoprolol tartrate (LOPRESSOR) 50 MG tablet Take 1 tablet (50 mg total) by mouth 2 (two) times daily. 04/22/20 07/21/20  Antoine Poche, MD  ondansetron (ZOFRAN) 4 MG tablet Take 4 mg by mouth every 8 (eight) hours  as needed. 12/31/19   [provider]    Allergies    Cinnamon, Shellfish allergy, Poison ivy extract, and Poison oak extract  Review of Systems   Review of Systems  Constitutional: Positive for activity change and appetite change. Negative for fever.  HENT: Negative for congestion and rhinorrhea.   Respiratory: Negative for cough, chest tightness and shortness of breath.   Gastrointestinal: Positive for abdominal pain, diarrhea, nausea and vomiting.  Genitourinary: Negative for dysuria and hematuria.  Musculoskeletal: Negative for arthralgias and myalgias.  Skin: Negative for rash.   Neurological: Negative for dizziness, weakness and headaches.   all other systems are negative except as noted in the HPI and PMH.    Physical Exam Updated Vital Signs Ht 5\' 3"  (1.6 m)   Wt 86.6 kg   BMI 33.83 kg/m   Physical Exam Vitals and nursing note reviewed.  Constitutional:      General: She is not in acute distress.    Appearance: She is well-developed.  HENT:     Head: Normocephalic and atraumatic.     Mouth/Throat:     Pharynx: No oropharyngeal exudate.  Eyes:     Conjunctiva/sclera: Conjunctivae normal.     Pupils: Pupils are equal, round, and reactive to light.  Neck:     Comments: No meningismus. Cardiovascular:     Rate and Rhythm: Tachycardia present. Rhythm irregular.     Heart sounds: Normal heart sounds. No murmur heard.   Pulmonary:     Effort: Pulmonary effort is normal. No respiratory distress.     Breath sounds: Normal breath sounds.  Abdominal:     Palpations: Abdomen is soft.     Tenderness: There is abdominal tenderness. There is guarding. There is no rebound.     Comments: Right lower quadrant and periumbilical tenderness with voluntary guarding.  No rebound.  Musculoskeletal:        General: No tenderness. Normal range of motion.     Cervical back: Normal range of motion and neck supple.  Skin:    General: Skin is warm.  Neurological:     Mental Status: She is alert and oriented to person, place, and time.     Cranial Nerves: No cranial nerve deficit.     Motor: No abnormal muscle tone.     Coordination: Coordination normal.     Comments:  5/5 strength throughout. CN 2-12 intact.Equal grip strength.   Psychiatric:        Behavior: Behavior normal.     ED Results / Procedures / Treatments   Labs (all labs ordered are listed, but only abnormal results are displayed) Labs Reviewed  CBC WITH DIFFERENTIAL/PLATELET - Abnormal; Notable for the following components:      Result Value   WBC 14.4 (*)    Neutro Abs 12.1 (*)    All other  components within normal limits  COMPREHENSIVE METABOLIC PANEL - Abnormal; Notable for the following components:   Glucose, Bld 171 (*)    Creatinine, Ser 1.19 (*)    GFR, Estimated 46 (*)    All other components within normal limits  LACTIC ACID, PLASMA - Abnormal; Notable for the following components:   Lactic Acid, Venous 3.4 (*)    All other components within normal limits  LIPASE, BLOOD  URINALYSIS, ROUTINE W REFLEX MICROSCOPIC  LACTIC ACID, PLASMA    EKG EKG Interpretation  Date/Time:  Saturday Aug 01 2020 04:47:48 EDT Ventricular Rate:  101 PR Interval:    QRS Duration: 108 QT  Interval:  352 QTC Calculation: 457 R Axis:   52 Text Interpretation: Atrial fibrillation Low voltage, precordial leads Consider RVH w/ secondary repol abnormality Nonspecific T abnormalities, lateral leads No significant change was found Confirmed by Glynn Octave [phone]) on 08/01/2020 5:06:47 AM   Radiology No results found.  Procedures Procedures   Medications Ordered in ED Medications  sodium chloride 0.9 % bolus 500 mL (has no administration in time range)  fentaNYL (SUBLIMAZE) injection 25 mcg (has no administration in time range)  ondansetron (ZOFRAN) injection 4 mg (has no administration in time range)    ED Course  I have reviewed the triage vital signs and the nursing notes.  Pertinent labs & imaging results that were available during my care of the patient were reviewed by me and considered in my medical decision making (see chart for details).    MDM Rules/Calculators/A&P                         Lower abdominal pain with nausea and vomiting concerning for appendicitis.  Patient hydrated.  Labs are concerning for lactic acidosis 3.4 with white count of 16.  She did develop some "heaviness" to the left side of her chest while in the emergency department.  Repeat EKG shows no acute ST changes.  We will add on troponins.  Given her elevated lactate and lower abdominal pain  will perform CT to evaluate for appendicitis or any other acute pathology.   Comfortable on recheck. Heart rate improved.  Awaiting CT and UA. Care to be transferred at shift change.   Final Clinical Impression(s) / ED Diagnoses Final diagnoses:  None    Rx / DC Orders ED Discharge Orders    None       Logen Heintzelman, Jeannett Senior, MD 08/01/20 [phone]

## 2020-08-01 NOTE — ED Notes (Signed)
Family at bedside. 

## 2020-08-01 NOTE — H&P (Signed)
History and Physical  [facility]  [place] HCW:[id number] DOB: 07-20-1940 DOA: 08/01/2020  PCP: Tanna Furry, MD  Patient coming from: Home by EMS  Level of care: Med-Surg  I have personally briefly reviewed patient's old medical records in [facility]  Chief Complaint: right flank/abdominal pain   HPI: Michelle Ayala is a 80 y.o. female with medical history significant for vertigo, hyperlipidemia, atrial fibrillation on apixaban, and hypertension was feeling her usual self yesterday but at 9:30 pm started having severe right flank and abdominal pain in waves associated with severe nausea and vomiting, unable to keep anything down by mouth and nothing seemed to make symptoms better or worse.  She tried taking zofran at home but could not keep it down.   ED Course: Initially there was concern of appendicitis.  She had elevated WBC of 14, she had a mild AKI with creatinine of 1.19, lactic acid was elevated at 3.4.  CT scan was completed with findings of normal appendix. There was evidence of a recently passed kidney stone of 3 mm with associated right sided hydroureteronephrosis. Urinalysis was positive for large RBC, 21-50 WBC/HPF and protein/glucose.  Repeat lactic acid remained elevated at 3.3.  Admission was requested for further management.     Review of Systems: Review of Systems  Constitutional: Negative for chills, diaphoresis, fever and malaise/fatigue.  HENT: Negative.   Eyes: Negative.   Respiratory: Negative.   Cardiovascular: Positive for palpitations. Negative for chest pain, leg swelling and PND.  Gastrointestinal: Positive for abdominal pain, nausea and vomiting.  Genitourinary: Positive for flank pain.  Musculoskeletal: Negative for myalgias.  Skin: Negative for rash.  Neurological: Negative.   Endo/Heme/Allergies: Negative.   Psychiatric/Behavioral: Negative.     Past Medical History:  Diagnosis Date  . High cholesterol   . Vertigo     Past  Surgical History:  Procedure Laterality Date  . BREAST SURGERY     cyst removal  . BUBBLE STUDY  06/24/2019   Procedure: BUBBLE STUDY;  Surgeon: Parke Poisson, MD;  Location: [facility] ENDOSCOPY;  Service: Cardiovascular;;  . CARDIOVERSION N/A 06/24/2019   Procedure: CARDIOVERSION;  Surgeon: Parke Poisson, MD;  Location[facility] ENDOSCOPY;  Service: Cardiovascular;  Laterality: N/A;  . CESAREAN SECTION    . TEE WITHOUT CARDIOVERSION N/A 06/24/2019   Procedure: TRANSESOPHAGEAL ECHOCARDIOGRAM (TEE);  Surgeon: Parke Poisson, MD;  Location: [facility] ENDOSCOPY;  Service: Cardiovascular;  Laterality: N/A;  . TUBAL LIGATION       reports that she has never smoked. She has never used smokeless tobacco. She reports previous alcohol use. She reports that she does not use drugs.  Allergies  Allergen Reactions  . Cinnamon Anaphylaxis  . Shellfish Allergy Anaphylaxis  . Poison Ivy Extract     Rash   . Poison Oak Extract     Rash     No family history on file.  Prior to Admission medications   Medication Sig Start Date End Date Taking? Authorizing Provider  acetaminophen (TYLENOL) 500 MG tablet Take 500 mg by mouth every 6 (six) hours as needed for mild pain.   Yes [provider]  apixaban (ELIQUIS) 5 MG TABS tablet Take 1 tablet (5 mg total) by mouth 2 (two) times daily. 04/22/20  Yes BranchDorothe Pea, MD  cholecalciferol (VITAMIN D) 25 MCG (1000 UNIT) tablet Take 1,000 Units by mouth daily. 03/30/20  Yes [provider]  ezetimibe-simvastatin (VYTORIN) 10-40 MG tablet Take 1 tablet by mouth daily. 01/24/20  Yes [provider]  meclizine (ANTIVERT) 25 MG tablet Take 1 tablet (25 mg total) by mouth 3 (three) times daily as needed for dizziness. 06/29/13  Yes Donnetta Hutching, MD  metoprolol tartrate (LOPRESSOR) 50 MG tablet Take 1 tablet (50 mg total) by mouth 2 (two) times daily. 04/22/20 07/21/20 Yes Branch, Dorothe Pea, MD  ondansetron (ZOFRAN) 4 MG tablet Take 4 mg by mouth  every 8 (eight) hours as needed for nausea or vomiting. 12/31/19  Yes [provider]    Physical Exam: Vitals:   08/01/20 0630 08/01/20 0700 08/01/20 0803 08/01/20 0920  BP: 127/85 (!) 144/74 138/84 138/81  Pulse: (!) 101 99 (!) 101 (!) 38  Resp: 17 20 20 19   Temp: 98.4 F (36.9 C)     TempSrc: Oral     SpO2: 100% 95% 98% 98%  Weight:      Height:       Constitutional: awake, alert, NAD, calm, comfortable Eyes: PERRL, lids and conjunctivae normal ENMT: Mucous membranes are dry. Posterior pharynx clear of any exudate or lesions.Normal dentition.  Neck: normal, supple, no masses, no thyromegaly Respiratory: clear to auscultation bilaterally, no wheezing, no crackles. Normal respiratory effort. No accessory muscle use.  Cardiovascular: normal s1, s2 sounds, no murmurs / rubs / gallops. No extremity edema. 2+ pedal pulses. No carotid bruits.  Abdomen: right CVA tenderness, suprapubic tenderness, no masses palpated. No hepatosplenomegaly. Bowel sounds positive.  Musculoskeletal: no clubbing / cyanosis. No joint deformity upper and lower extremities. Good ROM, no contractures. Normal muscle tone.  Skin: no rashes, lesions, ulcers. No induration Neurologic: CN 2-12 grossly intact. Sensation intact, DTR normal. Strength 5/5 in all 4.  Psychiatric: Normal judgment and insight. Alert and oriented x 3. Normal mood.   Labs on Admission: I have personally reviewed following labs and imaging studies  CBC: Recent Labs  Lab 08/01/20 0505  WBC 14.4*  NEUTROABS 12.1*  HGB 12.4  HCT 39.0  MCV 98.0  PLT 189   Basic Metabolic Panel: Recent Labs  Lab 08/01/20 0505  NA 139  K 3.8  CL 102  CO2 26  GLUCOSE 171*  BUN 17  CREATININE 1.19*  CALCIUM 9.5   GFR: Estimated Creatinine Clearance: 38.7 mL/min (A) (by C-G formula based on SCr of 1.19 mg/dL (H)). Liver Function Tests: Recent Labs  Lab 08/01/20 0505  AST 32  ALT 26  ALKPHOS 57  BILITOT 0.8  PROT 7.7  ALBUMIN 4.3    Recent Labs  Lab 08/01/20 0505  LIPASE 25   No results for input(s): AMMONIA in the last 168 hours. Coagulation Profile: No results for input(s): INR, PROTIME in the last 168 hours. Cardiac Enzymes: No results for input(s): CKTOTAL, CKMB, CKMBINDEX, TROPONINI in the last 168 hours. BNP (last 3 results) No results for input(s): PROBNP in the last 8760 hours. HbA1C: No results for input(s): HGBA1C in the last 72 hours. CBG: No results for input(s): GLUCAP in the last 168 hours. Lipid Profile: No results for input(s): CHOL, HDL, LDLCALC, TRIG, CHOLHDL, LDLDIRECT in the last 72 hours. Thyroid Function Tests: No results for input(s): TSH, T4TOTAL, FREET4, T3FREE, THYROIDAB in the last 72 hours. Anemia Panel: No results for input(s): VITAMINB12, FOLATE, FERRITIN, TIBC, IRON, RETICCTPCT in the last 72 hours. Urine analysis:    Component Value Date/Time   COLORURINE AMBER (A) 08/01/2020 0852   APPEARANCEUR CLOUDY (A) 08/01/2020 0852   LABSPEC 1.016 08/01/2020 0852   PHURINE 5.0 08/01/2020 0852   GLUCOSEU 50 (A) 08/01/2020  0852   HGBUR LARGE (A) 08/01/2020 0852   BILIRUBINUR NEGATIVE 08/01/2020 0852   KETONESUR NEGATIVE 08/01/2020 0852   PROTEINUR 100 (A) 08/01/2020 0852   NITRITE NEGATIVE 08/01/2020 0852   LEUKOCYTESUR NEGATIVE 08/01/2020 0852    Radiological Exams on Admission: CT ABDOMEN PELVIS WO CONTRAST  Result Date: 08/01/2020 CLINICAL DATA:  Right lower quadrant pain EXAM: CT ABDOMEN AND PELVIS WITHOUT CONTRAST TECHNIQUE: Multidetector CT imaging of the abdomen and pelvis was performed following the standard protocol without IV contrast. COMPARISON:  None. FINDINGS: Lower chest:  No acute finding.  Small sliding hiatal hernia. Hepatobiliary: Small scattered hepatic cysts.No evidence of biliary obstruction or stone. Pancreas: Unremarkable. Spleen: Unremarkable. Adrenals/Urinary Tract: Negative adrenals. Right renal expansion, hydronephrosis, and perinephric stranding. Fat  edema tracks along the right ureter. There is a 3 mm stone in the dependent urinary bladder, presumably passed recently from the right. 4 mm left lower pole renal calculus. No left hydronephrosis. Stomach/Bowel:  No obstruction. No appendicitis. Vascular/Lymphatic: No acute vascular abnormality. No mass or adenopathy. Reproductive:Multiple fibroids with dystrophic calcification. Other: No ascites or pneumoperitoneum. Musculoskeletal: No acute abnormalities. Disc and facet degeneration in the thoracic and lumbar spine. IMPRESSION: 1. Right hydroureteronephrosis and perinephric stranding likely related to recent passage of a 3 mm stone which is in the urinary bladder. 2. 4 mm left renal calculus. 3. Other chronic findings are described above. Electronically Signed   By: Marnee Spring M.D.   On: 08/01/2020 08:13    EKG: Independently reviewed. Atrial fibrillation  Assessment/Plan Principal Problem:   Lactic acidosis Active Problems:   Persistent atrial fibrillation (HCC)   Nephrolithiasis   Hydroureteronephrosis   UTI (urinary tract infection)   Acquired thrombophilia (HCC)   Anticoagulated   Leukocytosis   ACUTE Right flank pain   Acute right flank pain - due to nephroureterolithiasis. Fortunately it appears that patient has passed the stone and it is in the bladder.   Continue IV fluid and antibiotics as ordered.  Pain management as needed.   Right hydroureteronephrosis - thought secondary to recently passed stone.  Continue supportive management with IV fluids and IV antibiotics.  Consider getting renal US tomorrow to follow up.   UTI - follow urine culture.  Continue IV ceftriaxone.    Chronic atrial fibrillation - rate controlled on metoprolol which is continued, she is fully anticoagulated with apixaban.    Leukocytosis - secondary to UTI and kidney stone, check CBC / diff in AM.   Lactic acidosis - from severe dehydration from vomiting - treating with IV fluid hydration, recheck  lactic acid at noon today.    DVT prophylaxis: apixaban   Code Status: Full   Family Communication: plan of care discussed with patient at bedside  Disposition Plan: home   Consults called: n/a   Admission status: OBV  Level of care: Med-Surg Standley Dakins MD [facility] How to contact the [facility] Attending or Consulting provider 7A - 7P or covering provider during after hours 7P -7A, for this patient?  1. Check the care team in [facility] and look for a) attending/consulting [facility] provider listed and b) the [facility] team listed 2. Log into [email or web address] and use [place]'s universal password to access. If you do not have the password, please contact the hospital operator. 3. Locate the [facility] provider you are looking for under [facility] and page to a number that you can be directly reached. 4. If you still have difficulty reaching the provider, please page the [facility] (Director on Call)  for the Hospitalists listed on amion for assistance.   If 7PM-7AM, please contact night-coverage [email or web address] Password [email or web address]  08/01/2020, 10:08 AM

## 2020-08-01 NOTE — Progress Notes (Signed)
CRITICAL VALUE STICKER  CRITICAL VALUE: Lactic Acid 2.9  RECEIVER (on-site recipient of call):   DATE & TIME NOTIFIED:   MESSENGER (representative from lab):   MD NOTIFIED: Johnson  TIME OF NOTIFICATION: 1343  RESPONSE: Fluid Bolus

## 2020-08-01 NOTE — ED Triage Notes (Signed)
Pt BIB EMS c/o RL abd pain since 9:30pm with vomiting. Hx afib currently taking eliquis. Pt took zofran at home PTA and threw it up.

## 2020-08-01 NOTE — ED Notes (Signed)
Patient reminded that a urine specimen is still needed at this time. Patient states that she does not feel as if she needs to go.

## 2020-08-01 NOTE — Evaluation (Addendum)
Physical Therapy Evaluation Patient Details Name: Michelle Ayala MRN: [id number] DOB: [birth year] Today's Date: 08/01/2020   History of Present Illness  Michelle Ayala is a 80 y.o. female with medical history significant for vertigo, hyperlipidemia, atrial fibrillation on apixaban, and hypertension was feeling her usual self yesterday but at 9:30 pm started having severe right flank and abdominal pain in waves associated with severe nausea and vomiting, unable to keep anything down by mouth and nothing seemed to make symptoms better or worse.  She tried taking zofran at home but could not keep it down.    Clinical Impression  Patient functioning at baseline for functional mobility and gait. Patient completes all mobility slightly slow but without physical assist required. Patient provided with min guard for balance and safety with ambulation but she does not have loss of balance. Patient returned to bed at end of session. Patient discharged to care of nursing for ambulation daily as tolerated for length of stay.      Follow Up Recommendations No PT follow up    Equipment Recommendations  None recommended by PT    Recommendations for Other Services       Precautions / Restrictions Precautions Precautions: Fall Restrictions Weight Bearing Restrictions: No      Mobility  Bed Mobility Overal bed mobility: Modified Independent                  Transfers Overall transfer level: Modified independent Equipment used: None             General transfer comment: transfer to standing without AD  Ambulation/Gait Ambulation/Gait assistance: Min guard Gait Distance (Feet): 75 Feet Assistive device: None Gait Pattern/deviations: WFL(Within Functional Limits)     General Gait Details: slightly slow cadence, no loss of balance without AD  Stairs            Wheelchair Mobility    Modified Rankin (Stroke Patients Only)       Balance Overall balance assessment:  Independent                                           Pertinent Vitals/Pain Pain Assessment: No/denies pain    Home Living Family/patient expects to be discharged to:: Private residence Living Arrangements: Children Available Help at Discharge: Family;Available 24 hours/day Type of Home: Mobile home Home Access: Stairs to enter Entrance Stairs-Rails: Can reach both;Left;Right Entrance Stairs-Number of Steps: 5 Home Layout: One level Home Equipment: Cane - single point      Prior Function Level of Independence: Independent         Comments: Patient states short distance community ambulation without AD, independent with basic ADL     Hand Dominance        Extremity/Trunk Assessment   Upper Extremity Assessment Upper Extremity Assessment: Overall WFL for tasks assessed    Lower Extremity Assessment Lower Extremity Assessment: Overall WFL for tasks assessed    Cervical / Trunk Assessment Cervical / Trunk Assessment: Normal  Communication   Communication: No difficulties  Cognition Arousal/Alertness: Awake/alert Behavior During Therapy: WFL for tasks assessed/performed Overall Cognitive Status: Within Functional Limits for tasks assessed                                        General Comments  Exercises     Assessment/Plan    PT Assessment Patent does not need any further PT services  PT Problem List         PT Treatment Interventions      PT Goals (Current goals can be found in the Care Plan section)  Acute Rehab PT Goals Patient Stated Goal: return home PT Goal Formulation: With patient/family Time For Goal Achievement: 08/01/20 Potential to Achieve Goals: Good    Frequency     Barriers to discharge        Co-evaluation               AM-PAC PT "6 Clicks" Mobility  Outcome Measure Help needed turning from your back to your side while in a flat bed without using bedrails?: None Help needed  moving from lying on your back to sitting on the side of a flat bed without using bedrails?: None Help needed moving to and from a bed to a chair (including a wheelchair)?: None Help needed standing up from a chair using your arms (e.g., wheelchair or bedside chair)?: None Help needed to walk in hospital room?: A Little Help needed climbing 3-5 steps with a railing? : A Little 6 Click Score: 22    End of Session   Activity Tolerance: Patient tolerated treatment well Patient left: in bed;with family/visitor present;with call bell/phone within reach Nurse Communication: Mobility status PT Visit Diagnosis: Other abnormalities of gait and mobility (R26.89)    Time: 0370-4888 PT Time Calculation (min) (ACUTE ONLY): 13 min   Charges:   PT Evaluation $PT Eval Low Complexity: 1 Low          11:25 AM, 08/01/20 Michelle Ayala PT, DPT Physical Therapist at [facility]

## 2020-08-02 ENCOUNTER — Observation Stay (HOSPITAL_COMMUNITY): Payer: Medicare PPO

## 2020-08-02 DIAGNOSIS — Z7901 Long term (current) use of anticoagulants: Secondary | ICD-10-CM | POA: Diagnosis not present

## 2020-08-02 DIAGNOSIS — E872 Acidosis: Secondary | ICD-10-CM | POA: Diagnosis not present

## 2020-08-02 DIAGNOSIS — N1330 Unspecified hydronephrosis: Secondary | ICD-10-CM | POA: Diagnosis not present

## 2020-08-02 DIAGNOSIS — D6869 Other thrombophilia: Secondary | ICD-10-CM | POA: Diagnosis not present

## 2020-08-02 LAB — CBC WITH DIFFERENTIAL/PLATELET
Abs Immature Granulocytes: 0.02 10*3/uL (ref 0.00–0.07)
Basophils Absolute: 0 10*3/uL (ref 0.0–0.1)
Basophils Relative: 0 %
Eosinophils Absolute: 0.1 10*3/uL (ref 0.0–0.5)
Eosinophils Relative: 1 %
HCT: 34.8 % — ABNORMAL LOW (ref 36.0–46.0)
Hemoglobin: 10.8 g/dL — ABNORMAL LOW (ref 12.0–15.0)
Immature Granulocytes: 0 %
Lymphocytes Relative: 31 %
Lymphs Abs: 3.1 10*3/uL (ref 0.7–4.0)
MCH: 30.7 pg (ref 26.0–34.0)
MCHC: 31 g/dL (ref 30.0–36.0)
MCV: 98.9 fL (ref 80.0–100.0)
Monocytes Absolute: 0.7 10*3/uL (ref 0.1–1.0)
Monocytes Relative: 8 %
Neutro Abs: 5.8 10*3/uL (ref 1.7–7.7)
Neutrophils Relative %: 60 %
Platelets: 154 10*3/uL (ref 150–400)
RBC: 3.52 MIL/uL — ABNORMAL LOW (ref 3.87–5.11)
RDW: 13.4 % (ref 11.5–15.5)
WBC: 9.8 10*3/uL (ref 4.0–10.5)
nRBC: 0 % (ref 0.0–0.2)

## 2020-08-02 LAB — MAGNESIUM: Magnesium: 1.8 mg/dL (ref 1.7–2.4)

## 2020-08-02 LAB — URINE CULTURE: Culture: <10000 — AB

## 2020-08-02 LAB — LACTIC ACID, PLASMA: Lactic Acid, Venous: 1.7 mmol/L (ref 0.5–1.9)

## 2020-08-02 LAB — COMPREHENSIVE METABOLIC PANEL
ALT: 21 U/L (ref 0–44)
AST: 28 U/L (ref 15–41)
Albumin: 3.6 g/dL (ref 3.5–5.0)
Alkaline Phosphatase: 49 U/L (ref 38–126)
Anion gap: 7 (ref 5–15)
BUN: 15 mg/dL (ref 8–23)
CO2: 26 mmol/L (ref 22–32)
Calcium: 8.6 mg/dL — ABNORMAL LOW (ref 8.9–10.3)
Chloride: 105 mmol/L (ref 98–111)
Creatinine, Ser: 0.95 mg/dL (ref 0.44–1.00)
GFR, Estimated: >60 mL/min (ref >60)
Glucose, Bld: 127 mg/dL — ABNORMAL HIGH (ref 70–99)
Potassium: 3.6 mmol/L (ref 3.5–5.1)
Sodium: 138 mmol/L (ref 135–145)
Total Bilirubin: 0.8 mg/dL (ref 0.3–1.2)
Total Protein: 6.5 g/dL (ref 6.5–8.1)

## 2020-08-02 LAB — SARS CORONAVIRUS 2 (TAT 6-24 HRS): SARS Coronavirus 2: NEGATIVE

## 2020-08-02 MED ORDER — SODIUM CHLORIDE 0.9 % IV SOLN: 1.0000 g | INTRAVENOUS | Status: DC

## 2020-08-02 MED ORDER — CEFDINIR 300 MG PO CAPS: 300.0000 mg | ORAL_CAPSULE | Freq: Every day | ORAL | 0 refills | Status: AC

## 2020-08-02 NOTE — Care Management Obs Status (Signed)
MEDICARE OBSERVATION STATUS NOTIFICATION   Patient Details  Name: Michelle Ayala MRN: [id number] Date of Birth: 1940/10/25   Medicare Observation Status Notification Given:  Yes    Barry Brunner, LCSW 08/02/2020, 12:56 PM

## 2020-08-02 NOTE — Discharge Summary (Signed)
Physician Discharge Summary  Michelle Ayala NWG:[id number] DOB: Sep 27, 1940 DOA: 08/01/2020  PCP: Tanna Furry, MD  Admit date: 08/01/2020 Discharge date: 08/02/2020  Admitted From:  Home  Disposition: Home   Recommendations for Outpatient Follow-up:  1. Follow up with PCP in 1 weeks 2. Establish care with urologist in 2 weeks regarding kidney stones  Discharge Condition: STABLE   CODE STATUS: FULL DIET: heart healthy    Brief Hospitalization Summary: Please see all hospital notes, images, labs for full details of the hospitalization. ADMISSION HPI: Michelle Ayala is a 80 y.o. female with medical history significant for vertigo, hyperlipidemia, atrial fibrillation on apixaban, and hypertension was feeling her usual self yesterday but at 9:30 pm started having severe right flank and abdominal pain in waves associated with severe nausea and vomiting, unable to keep anything down by mouth and nothing seemed to make symptoms better or worse.  She tried taking zofran at home but could not keep it down.   ED Course: Initially there was concern of appendicitis.  She had elevated WBC of 14, she had a mild AKI with creatinine of 1.19, lactic acid was elevated at 3.4.  CT scan was completed with findings of normal appendix. There was evidence of a recently passed kidney stone of 3 mm with associated right sided hydroureteronephrosis. Urinalysis was positive for large RBC, 21-50 WBC/HPF and protein/glucose.  Repeat lactic acid remained elevated at 3.3.  Admission was requested for further management.     HOSPITAL COURSE  Acute right flank pain - due to nephroureterolithiasis. Fortunately it appears that patient has passed the stone and it is in the bladder.   Pt was treated with IV fluid and antibiotics as ordered.   Right hydroureteronephrosis - RESOLVED thought secondary to recently passed stone.  Continue supportive management with IV fluids and IV antibiotics.   renal US shows resolution.     UTI - follow urine culture.  Treated with IV ceftriaxone.  DC on oral cefdinir x 3 d.   Chronic atrial fibrillation - rate controlled on metoprolol which is continued, she is fully anticoagulated with apixaban.    Leukocytosis - resolved, secondary to UTI and kidney stone.   Lactic acidosis - RESOLVED.  From severe dehydration from vomiting - treating with IV fluid hydration, recheck lactic acid at noon today.    DVT prophylaxis: apixaban   Code Status: Full   Family Communication: plan of care discussed with patient at bedside  Disposition Plan: home   Consults called: n/a   Admission status: OBV  Level of care: Med-Surg  Discharge Diagnoses:  Principal Problem:   Lactic acidosis Active Problems:   Persistent atrial fibrillation (HCC)   Nephrolithiasis   Hydroureteronephrosis   UTI (urinary tract infection)   Acquired thrombophilia (HCC)   Anticoagulated   Leukocytosis   ACUTE Right flank pain   Discharge Instructions: Discharge Instructions    Ambulatory referral to Urology   Complete by: As directed      Allergies as of 08/02/2020      Reactions   Cinnamon Anaphylaxis   Shellfish Allergy Anaphylaxis   Poison Ivy Extract    Rash    Poison Oak Extract    Rash       Medication List    TAKE these medications   acetaminophen 500 MG tablet Commonly known as: TYLENOL Take 500 mg by mouth every 6 (six) hours as needed for mild pain.   apixaban 5 MG Tabs tablet Commonly known as: ELIQUIS Take 1 tablet (  5 mg total) by mouth 2 (two) times daily.   cefdinir 300 MG capsule Commonly known as: OMNICEF Take 1 capsule (300 mg total) by mouth daily for 3 days. Start taking on: Aug 03, 2020   cholecalciferol 25 MCG (1000 UNIT) tablet Commonly known as: VITAMIN D Take 1,000 Units by mouth daily.   ezetimibe-simvastatin 10-40 MG tablet Commonly known as: VYTORIN Take 1 tablet by mouth daily.   meclizine 25 MG tablet Commonly known as: Antivert Take 1  tablet (25 mg total) by mouth 3 (three) times daily as needed for dizziness.   metoprolol tartrate 50 MG tablet Commonly known as: LOPRESSOR Take 1 tablet (50 mg total) by mouth 2 (two) times daily.   ondansetron 4 MG tablet Commonly known as: ZOFRAN Take 4 mg by mouth every 8 (eight) hours as needed for nausea or vomiting.       Follow-up Information    Zhou-Talbert, Ralene BatheSerena S, MD. Schedule an appointment as soon as possible for a visit in 1 week(s).   Specialty: Family Medicine Why: Hospital Follow Up  Contact information: [address] [place] [address] [phone]        Antoine PocheBranch, Jonathan F, MD .   Specialty: Cardiology Contact information: [address] [place] [address] [phone]        [facility]. Schedule an appointment as soon as possible for a visit in 2 week(s).   Specialty: Urology Why: Follow up for Kidney Stones  Contact information: [phone]             Allergies  Allergen Reactions  . Cinnamon Anaphylaxis  . Shellfish Allergy Anaphylaxis  . Poison Ivy Extract     Rash   . Poison Oak Extract     Rash    Allergies as of 08/02/2020      Reactions   Cinnamon Anaphylaxis   Shellfish Allergy Anaphylaxis   Poison Ivy Extract    Rash    Poison Oak Extract    Rash       Medication List    TAKE these medications   acetaminophen 500 MG tablet Commonly known as: TYLENOL Take 500 mg by mouth every 6 (six) hours as needed for mild pain.   apixaban 5 MG Tabs tablet Commonly known as: ELIQUIS Take 1 tablet (5 mg total) by mouth 2 (two) times daily.   cefdinir 300 MG capsule Commonly known as: OMNICEF Take 1 capsule (300 mg total) by mouth daily for 3 days. Start taking on: Aug 03, 2020   cholecalciferol 25 MCG (1000 UNIT) tablet Commonly known as: VITAMIN D Take 1,000 Units by mouth daily.   ezetimibe-simvastatin 10-40 MG tablet Commonly known as: VYTORIN Take 1 tablet by mouth  daily.   meclizine 25 MG tablet Commonly known as: Antivert Take 1 tablet (25 mg total) by mouth 3 (three) times daily as needed for dizziness.   metoprolol tartrate 50 MG tablet Commonly known as: LOPRESSOR Take 1 tablet (50 mg total) by mouth 2 (two) times daily.   ondansetron 4 MG tablet Commonly known as: ZOFRAN Take 4 mg by mouth every 8 (eight) hours as needed for nausea or vomiting.       Procedures/Studies: CT ABDOMEN PELVIS WO CONTRAST  Result Date: 08/01/2020 CLINICAL DATA:  Right lower quadrant pain EXAM: CT ABDOMEN AND PELVIS WITHOUT CONTRAST TECHNIQUE: Multidetector CT imaging of the abdomen and pelvis was performed following the standard protocol without IV contrast. COMPARISON:  None. FINDINGS: Lower  chest:  No acute finding.  Small sliding hiatal hernia. Hepatobiliary: Small scattered hepatic cysts.No evidence of biliary obstruction or stone. Pancreas: Unremarkable. Spleen: Unremarkable. Adrenals/Urinary Tract: Negative adrenals. Right renal expansion, hydronephrosis, and perinephric stranding. Fat edema tracks along the right ureter. There is a 3 mm stone in the dependent urinary bladder, presumably passed recently from the right. 4 mm left lower pole renal calculus. No left hydronephrosis. Stomach/Bowel:  No obstruction. No appendicitis. Vascular/Lymphatic: No acute vascular abnormality. No mass or adenopathy. Reproductive:Multiple fibroids with dystrophic calcification. Other: No ascites or pneumoperitoneum. Musculoskeletal: No acute abnormalities. Disc and facet degeneration in the thoracic and lumbar spine. IMPRESSION: 1. Right hydroureteronephrosis and perinephric stranding likely related to recent passage of a 3 mm stone which is in the urinary bladder. 2. 4 mm left renal calculus. 3. Other chronic findings are described above. Electronically Signed   By: Marnee Spring M.D.   On: 08/01/2020 08:13   US RENAL  Result Date: 08/02/2020 CLINICAL DATA:  80 year old female  with right hydronephrosis on recent CT abdomen pelvis. EXAM: RENAL / URINARY TRACT ULTRASOUND COMPLETE COMPARISON:  CT abdomen pelvis from 08/01/2020 FINDINGS: Right Kidney: Renal measurements: 12.7 x 5.0 x 5.9 cm = volume: 196 mL. Echogenicity within normal limits. No mass or hydronephrosis visualized. Left Kidney: Renal measurements: 11.7 x 5.7 x 5.0 cm = volume: 211 mL. Echogenicity within normal limits. Punctate inferior pole shadowing renal calculus is visualized. No mass or hydronephrosis visualized. Bladder: Appears normal for degree of bladder distention. Other: Simple cyst in the right lobe of the liver is again seen measuring up to 1.8 cm. IMPRESSION: 1. No hydronephrosis. 2. Punctate, nonobstructive left inferior pole nephrolithiasis. Electronically Signed   By: Marliss Coots MD   On: 08/02/2020 11:34      Subjective: Pt says she is feeling much better, she has been urinating without any difficulty.  She is eating and drinking well.    Discharge Exam: Vitals:   08/02/20 0357 08/02/20 0852  BP: (!) 104/58 107/73  Pulse: 64 78  Resp: 15   Temp: 98.2 F (36.8 C)   SpO2: 98% 97%   Vitals:   08/01/20 1716 08/01/20 2011 08/02/20 0357 08/02/20 0852  BP: 123/75 107/60 (!) 104/58 107/73  Pulse: 88 73 64 78  Resp: 16 18 15    Temp: 98.6 F (37 C) 98.7 F (37.1 C) 98.2 F (36.8 C)   TempSrc: Oral Oral Oral   SpO2: 98% 99% 98% 97%  Weight:      Height:       General: Pt is alert, awake, not in acute distress Cardiovascular: normal S1/S2 +, no rubs, no gallops Respiratory: CTA bilaterally, no wheezing, no rhonchi Abdominal: Soft, NT, ND, bowel sounds + Extremities: no edema, no cyanosis   The results of significant diagnostics from this hospitalization (including imaging, microbiology, ancillary and laboratory) are listed below for reference.     Microbiology: Recent Results (from the past 240 hour(s))  Urine culture     Status: None (Preliminary result)   Collection Time:  08/01/20  8:52 AM   Specimen: Urine, Clean Catch  Result Value Ref Range Status   Specimen Description   Final    URINE, CLEAN CATCH Performed at [facility], [address] [place], [place]    Special Requests   Final    NONE Performed at [facility], [address] [place], [place]    Culture   Final    CULTURE REINCUBATED FOR BETTER GROWTH  Performed at [facility], [address], [place], [place] [address]    Report Status PENDING  Incomplete  SARS CORONAVIRUS 2 (TAT 6-24 HRS) Nasopharyngeal Nasopharyngeal Swab     Status: None   Collection Time: 08/01/20 10:13 AM   Specimen: Nasopharyngeal Swab  Result Value Ref Range Status   SARS Coronavirus 2 NEGATIVE NEGATIVE Final    Comment: (NOTE) SARS-CoV-2 target nucleic acids are NOT DETECTED.  The SARS-CoV-2 RNA is generally detectable in upper and lower respiratory specimens during the acute phase of infection. Negative results do not preclude SARS-CoV-2 infection, do not rule out co-infections with other pathogens, and should not be used as the sole basis for treatment or other patient management decisions. Negative results must be combined with clinical observations, patient history, and epidemiological information. The expected result is Negative.  Fact Sheet for Patients: HairSlick.no  Fact Sheet for Healthcare Providers: quierodirigir.com  This test is not yet approved or cleared by the [place] FDA and  has been authorized for detection and/or diagnosis of SARS-CoV-2 by FDA under an Emergency Use Authorization (EUA). This EUA will remain  in effect (meaning this test can be used) for the duration of the COVID-19 declaration under Se ction 564(b)(1) of the Act, 21 U.S.C. section 360bbb-3(b)(1), unless the authorization is terminated or revoked sooner.  Performed at [facility], [address], [place], [place] [address]       Labs: BNP (last 3 results) No results for input(s): BNP in the last 8760 hours. Basic Metabolic Panel: Recent Labs  Lab 08/01/20 0505 08/02/20 0419  NA 139 138  K 3.8 3.6  CL 102 105  CO2 26 26  GLUCOSE 171* 127*  BUN 17 15  CREATININE 1.19* 0.95  CALCIUM 9.5 8.6*  MG  --  1.8   Liver Function Tests: Recent Labs  Lab 08/01/20 0505 08/02/20 0419  AST 32 28  ALT 26 21  ALKPHOS 57 49  BILITOT 0.8 0.8  PROT 7.7 6.5  ALBUMIN 4.3 3.6   Recent Labs  Lab 08/01/20 0505  LIPASE 25   No results for input(s): AMMONIA in the last 168 hours. CBC: Recent Labs  Lab 08/01/20 0505 08/02/20 0419  WBC 14.4* 9.8  NEUTROABS 12.1* 5.8  HGB 12.4 10.8*  HCT 39.0 34.8*  MCV 98.0 98.9  PLT 189 154   Cardiac Enzymes: No results for input(s): CKTOTAL, CKMB, CKMBINDEX, TROPONINI in the last 168 hours. BNP: Invalid input(s): POCBNP CBG: No results for input(s): GLUCAP in the last 168 hours. D-Dimer No results for input(s): DDIMER in the last 72 hours. Hgb A1c No results for input(s): HGBA1C in the last 72 hours. Lipid Profile No results for input(s): CHOL, HDL, LDLCALC, TRIG, CHOLHDL, LDLDIRECT in the last 72 hours. Thyroid function studies No results for input(s): TSH, T4TOTAL, T3FREE, THYROIDAB in the last 72 hours.  Invalid input(s): FREET3 Anemia work up No results for input(s): VITAMINB12, FOLATE, FERRITIN, TIBC, IRON, RETICCTPCT in the last 72 hours. Urinalysis    Component Value Date/Time   COLORURINE AMBER (A) 08/01/2020 0852   APPEARANCEUR CLOUDY (A) 08/01/2020 0852   LABSPEC 1.016 08/01/2020 0852   PHURINE 5.0 08/01/2020 0852   GLUCOSEU 50 (A) 08/01/2020 0852   HGBUR LARGE (A) 08/01/2020 0852   BILIRUBINUR NEGATIVE 08/01/2020 0852   KETONESUR NEGATIVE 08/01/2020 0852   PROTEINUR 100 (A) 08/01/2020 0852   NITRITE NEGATIVE 08/01/2020 0852   LEUKOCYTESUR NEGATIVE 08/01/2020 0852   Sepsis Labs Invalid input(s): PROCALCITONIN,  WBC,   LACTICIDVEN Microbiology Recent Results (from the past 240 hour(s))  Urine culture     Status: None (Preliminary result)   Collection Time: 08/01/20  8:52 AM   Specimen: Urine, Clean Catch  Result Value Ref Range Status   Specimen Description   Final    URINE, CLEAN CATCH Performed at [facility], [address], [place], [place] [address]    Special Requests   Final    NONE Performed at [facility], [address] [place], [place] [address]    Culture   Final    CULTURE REINCUBATED FOR BETTER GROWTH Performed at [facility], [address] [place], [place] [address]    Report Status PENDING  Incomplete  SARS CORONAVIRUS 2 (TAT 6-24 HRS) Nasopharyngeal Nasopharyngeal Swab     Status: None   Collection Time: 08/01/20 10:13 AM   Specimen: Nasopharyngeal Swab  Result Value Ref Range Status   SARS Coronavirus 2 NEGATIVE NEGATIVE Final    Comment: (NOTE) SARS-CoV-2 target nucleic acids are NOT DETECTED.  The SARS-CoV-2 RNA is generally detectable in upper and lower respiratory specimens during the acute phase of infection. Negative results do not preclude SARS-CoV-2 infection, do not rule out co-infections with other pathogens, and should not be used as the sole basis for treatment or other patient management decisions. Negative results must be combined with clinical observations, patient history, and epidemiological information. The expected result is Negative.  Fact Sheet for Patients: HairSlick.no  Fact Sheet for Healthcare Providers: quierodirigir.com  This test is not yet approved or cleared by the [place] FDA and  has been authorized for detection and/or diagnosis of SARS-CoV-2 by FDA under an Emergency Use Authorization (EUA). This EUA will remain  in effect (meaning this test can be used) for the duration of the COVID-19 declaration under Se ction 564(b)(1) of the Act, 21 U.S.C. section  360bbb-3(b)(1), unless the authorization is terminated or revoked sooner.  Performed at [facility], [address] [place], [place] [address]    Time coordinating discharge:   SIGNED:  Standley Dakins, MD  [facility] 08/02/2020, 12:31 PM How to contact the [facility] Attending or Consulting provider 7A - 7P or covering provider during after hours 7P -7A, for this patient?  1. Check the care team in [facility] and look for a) attending/consulting [facility] provider listed and b) the [facility] team listed 2. Log into [email or web address] and use [place]'s universal password to access. If you do not have the password, please contact the hospital operator. 3. Locate the [facility] provider you are looking for under [facility] and page to a number that you can be directly reached. 4. If you still have difficulty reaching the provider, please page the [facility] (Director on Call) for the Hospitalists listed on amion for assistance.

## 2020-08-10 ENCOUNTER — Other Ambulatory Visit: Payer: Self-pay

## 2020-08-10 ENCOUNTER — Ambulatory Visit (INDEPENDENT_AMBULATORY_CARE_PROVIDER_SITE_OTHER): Payer: Medicare PPO | Admitting: Urology

## 2020-08-10 ENCOUNTER — Encounter: Payer: Self-pay | Admitting: Urology

## 2020-08-10 VITALS — BP 158/104 | HR 87 | Temp 98.3°F | Ht 63.0 in

## 2020-08-10 DIAGNOSIS — R3129 Other microscopic hematuria: Secondary | ICD-10-CM

## 2020-08-10 DIAGNOSIS — N200 Calculus of kidney: Secondary | ICD-10-CM

## 2020-08-10 LAB — URINALYSIS, ROUTINE W REFLEX MICROSCOPIC
Bilirubin, UA: NEGATIVE
Glucose, UA: NEGATIVE
Ketones, UA: NEGATIVE
Leukocytes,UA: NEGATIVE
Nitrite, UA: NEGATIVE
Specific Gravity, UA: >1.03 — ABNORMAL HIGH (ref 1.005–1.030)
Urobilinogen, Ur: 1 mg/dL (ref 0.2–1.0)
pH, UA: 6 (ref 5.0–7.5)

## 2020-08-10 NOTE — Progress Notes (Signed)
08/10/2020 8:53 AM   Teodora Medici Jan 28, 1941 [id number]  Referring provider: Tanna Furry, MD [address] [place],  [place] [address]  No chief complaint on file.   HPI:  Bryleigh was referred for kidney stones.  She underwent a CT scan of the abdomen and pelvis 08/01/2020 for right lower quadrant pain.  This showed some mild right hydroureteronephrosis and perinephric fluid consistent with a passed stone.  There are no other right sided stone. There was a small 3 to 4 mm left lower pole stone. She passed the stone prior to the CT (nurse saw it in strainer or toilet). UA showed greater than 50 red blood cells per high-powered field. Follow-up ultrasound was done 08/02/2020 and the hydronephrosis resolved, left lower pole stone again visualized.  She is well today. No prior h/o stones. She was rx'd HCTZ.   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   PMH: Past Medical History:  Diagnosis Date  . High cholesterol   . Vertigo     Surgical History: Past Surgical History:  Procedure Laterality Date  . BREAST SURGERY     cyst removal  . BUBBLE STUDY  06/24/2019   Procedure: BUBBLE STUDY;  Surgeon: Parke Poisson, MD;  Location: [facility] ENDOSCOPY;  Service: Cardiovascular;;  . CARDIOVERSION N/A 06/24/2019   Procedure: CARDIOVERSION;  Surgeon: Parke Poisson, MD;  Location: [facility] ENDOSCOPY;  Service: Cardiovascular;  Laterality: N/A;  . CESAREAN SECTION    . TEE WITHOUT CARDIOVERSION N/A 06/24/2019   Procedure: TRANSESOPHAGEAL ECHOCARDIOGRAM (TEE);  Surgeon: Parke Poisson, MD;  Location: [facility] ENDOSCOPY;  Service: Cardiovascular;  Laterality: N/A;  . TUBAL LIGATION      Home Medications:  Allergies as of 08/10/2020      Reactions   Cinnamon Anaphylaxis   Shellfish Allergy Anaphylaxis   Poison Ivy Extract    Rash    Poison Oak Extract    Rash       Medication List       Accurate as of August 10, 2020  8:53 AM. If you have any questions, ask your nurse or doctor.         acetaminophen 500 MG tablet Commonly known as: TYLENOL Take 500 mg by mouth every 6 (six) hours as needed for mild pain.   apixaban 5 MG Tabs tablet Commonly known as: ELIQUIS Take 1 tablet (5 mg total) by mouth 2 (two) times daily.   cholecalciferol 25 MCG (1000 UNIT) tablet Commonly known as: VITAMIN D Take 1,000 Units by mouth daily.   ezetimibe-simvastatin 10-40 MG tablet Commonly known as: VYTORIN Take 1 tablet by mouth daily.   hydrochlorothiazide 12.5 MG tablet Commonly known as: HYDRODIURIL Take 12.5 mg by mouth daily.   meclizine 25 MG tablet Commonly known as: Antivert Take 1 tablet (25 mg total) by mouth 3 (three) times daily as needed for dizziness.   metoprolol tartrate 50 MG tablet Commonly known as: LOPRESSOR Take 1 tablet (50 mg total) by mouth 2 (two) times daily.   ondansetron 4 MG tablet Commonly known as: ZOFRAN Take 4 mg by mouth every 8 (eight) hours as needed for nausea or vomiting.   tamsulosin 0.4 MG Caps capsule Commonly known as: FLOMAX Take 0.4 mg by mouth daily.       Allergies:  Allergies  Allergen Reactions  . Cinnamon Anaphylaxis  . Shellfish Allergy Anaphylaxis  . Poison Ivy Extract     Rash   . Poison [place] Oil  Rash     Family History: No family history on file.  Social History:  reports that she has never smoked. She has never used smokeless tobacco. She reports previous alcohol use. She reports that she does not use drugs.   Physical Exam: BP (!) 158/104   Pulse 87   Temp 98.3 F (36.8 C) (Oral)   Constitutional:  Alert and oriented, No acute distress. HEENT: [place] AT, moist mucus membranes.  Trachea midline, no masses. Cardiovascular: No clubbing, cyanosis, or edema. Respiratory: Normal respiratory effort, no increased work of breathing. GI: Abdomen is soft, nontender, nondistended, no abdominal masses GU: No CVA tenderness Skin: No rashes, bruises or suspicious lesions. Neurologic: Grossly intact, no focal  deficits, moving all 4 extremities. Psychiatric: Normal mood and affect.  Laboratory Data: Lab Results  Component Value Date   WBC 9.8 08/02/2020   HGB 10.8 (L) 08/02/2020   HCT 34.8 (L) 08/02/2020   MCV 98.9 08/02/2020   PLT 154 08/02/2020    Lab Results  Component Value Date   CREATININE 0.95 08/02/2020    No results found for: PSA  No results found for: TESTOSTERONE  No results found for: HGBA1C  Urinalysis    Component Value Date/Time   COLORURINE AMBER (A) 08/01/2020 0852   APPEARANCEUR CLOUDY (A) 08/01/2020 0852   LABSPEC 1.016 08/01/2020 0852   PHURINE 5.0 08/01/2020 0852   GLUCOSEU 50 (A) 08/01/2020 0852   HGBUR LARGE (A) 08/01/2020 0852   BILIRUBINUR NEGATIVE 08/01/2020 0852   KETONESUR NEGATIVE 08/01/2020 0852   PROTEINUR 100 (A) 08/01/2020 0852   NITRITE NEGATIVE 08/01/2020 0852   LEUKOCYTESUR NEGATIVE 08/01/2020 0852    Lab Results  Component Value Date   BACTERIA RARE (A) 08/01/2020    Pertinent Imaging: CT scan and ultrasound images reviewed No results found for this or any previous visit.  No results found for this or any previous visit.  No results found for this or any previous visit.  No results found for this or any previous visit.  Results for orders placed during the hospital encounter of 08/01/20  US RENAL  Narrative CLINICAL DATA:  80 year old female with right hydronephrosis on recent CT abdomen pelvis.  EXAM: RENAL / URINARY TRACT ULTRASOUND COMPLETE  COMPARISON:  CT abdomen pelvis from 08/01/2020  FINDINGS: Right Kidney:  Renal measurements: 12.7 x 5.0 x 5.9 cm = volume: 196 mL. Echogenicity within normal limits. No mass or hydronephrosis visualized.  Left Kidney:  Renal measurements: 11.7 x 5.7 x 5.0 cm = volume: 211 mL. Echogenicity within normal limits. Punctate inferior pole shadowing renal calculus is visualized. No mass or hydronephrosis visualized.  Bladder:  Appears normal for degree of bladder  distention.  Other:  Simple cyst in the right lobe of the liver is again seen measuring up to 1.8 cm.  IMPRESSION: 1. No hydronephrosis. 2. Punctate, nonobstructive left inferior pole nephrolithiasis.   Electronically Signed By: Marliss Coots MD On: 08/02/2020 11:34  No results found for this or any previous visit.  No results found for this or any previous visit.  No results found for this or any previous visit.   Assessment & Plan:    1. Kidney stones Discussed nature risk and benefits of surveillance versus ureteroscopy or shockwave with a small left lower pole stone.  We will continue surveillance.  I will see her back in 3 months with a KUB. - Urinalysis, Routine w reflex microscopic  2.  Microscopic hematuria-likely related to stone passage.  UA today clear.  No follow-ups on file.  Jerilee Field, MD

## 2020-08-10 NOTE — Patient Instructions (Signed)
Kidney Stones  Kidney stones are solid, rock-like deposits that form inside of the kidneys. The kidneys are a pair of organs that make urine. A kidney stone may form in a kidney and move into other parts of the urinary tract, including the tubes that connect the kidneys to the bladder (ureters), the bladder, and the tube that carries urine out of the body (urethra). As the stone moves through these areas, it can cause intense pain and block the flow of urine. Kidney stones are created when high levels of certain minerals are found in the urine. The stones are usually passed out of the body through urination, but in some cases, medical treatment may be needed to remove them. What are the causes? Kidney stones may be caused by:  A condition in which certain glands produce too much parathyroid hormone (primary hyperparathyroidism), which causes too much calcium buildup in the blood.  A buildup of uric acid crystals in the bladder (hyperuricosuria). Uric acid is a chemical that the body produces when you eat certain foods. It usually exits the body in the urine.  Narrowing (stricture) of one or both of the ureters.  A kidney blockage that is present at birth (congenital obstruction).  Past surgery on the kidney or the ureters, such as gastric bypass surgery. What increases the risk? The following factors may make you more likely to develop this condition:  Having had a kidney stone in the past.  Having a family history of kidney stones.  Not drinking enough water.  Eating a diet that is high in protein, salt (sodium), or sugar.  Being overweight or obese. What are the signs or symptoms? Symptoms of a kidney stone may include:  Pain in the side of the abdomen, right below the ribs (flank pain). Pain usually spreads (radiates) to the groin.  Needing to urinate frequently or urgently.  Painful urination.  Blood in the urine (hematuria).  Nausea.  Vomiting.  Fever and chills. How  is this diagnosed? This condition may be diagnosed based on:  Your symptoms and medical history.  A physical exam.  Blood tests.  Urine tests. These may be done before and after the stone passes out of your body through urination.  Imaging tests, such as a CT scan, abdominal X-ray, or ultrasound.  A procedure to examine the inside of the bladder (cystoscopy). How is this treated? Treatment for kidney stones depends on the size, location, and makeup of the stones. Kidney stones will often pass out of the body through urination. You may need to:  Increase your fluid intake to help pass the stone. In some cases, you may be given fluids through an IV and may need to be monitored at the hospital.  Take medicine for pain.  Make changes in your diet to help prevent kidney stones from coming back. Sometimes, medical procedures are needed to remove a kidney stone. This may involve:  A procedure to break up kidney stones using: ? A focused beam of light (laser therapy). ? Shock waves (extracorporeal shock wave lithotripsy).  Surgery to remove kidney stones. This may be needed if you have severe pain or have stones that block your urinary tract. Follow these instructions at home: Medicines  Take over-the-counter and prescription medicines only as told by your health care provider.  Ask your health care provider if the medicine prescribed to you requires you to avoid driving or using heavy machinery. Eating and drinking  Drink enough fluid to keep your urine pale yellow.   You may be instructed to drink at least 8-10 glasses of water each day. This will help you pass the kidney stone.  If directed, change your diet. This may include: ? Limiting how much sodium you eat. ? Eating more fruits and vegetables. ? Limiting how much animal protein--such as red meat, poultry, fish, and eggs--you eat.  Follow instructions from your health care provider about eating or drinking  restrictions. General instructions  Collect urine samples as told by your health care provider. You may need to collect a urine sample: ? 24 hours after you pass the stone. ? 8-12 weeks after passing the kidney stone, and every 6-12 months after that.  Strain your urine every time you urinate, for as long as directed. Use the strainer that your health care provider recommends.  Do not throw out the kidney stone after passing it. Keep the stone so it can be tested by your health care provider. Testing the makeup of your kidney stone may help prevent you from getting kidney stones in the future.  Keep all follow-up visits as told by your health care provider. This is important. You may need follow-up X-rays or ultrasounds to make sure that your stone has passed. How is this prevented? To prevent another kidney stone:  Drink enough fluid to keep your urine pale yellow. This is the best way to prevent kidney stones.  Eat a healthy diet and follow recommendations from your health care provider about foods to avoid. You may be instructed to eat a low-protein diet. Recommendations vary depending on the type of kidney stone that you have.  Maintain a healthy weight.   Where to find more information  National Kid[employer] Foundation (NKF): www.kidney.org  [facility] ([facility]): www.urologyhealth.org Contact a health care provider if:  You have pain that gets worse or does not get better with medicine. Get help right away if:  You have a fever or chills.  You develop severe pain.  You develop new abdominal pain.  You faint.  You are unable to urinate. Summary  Kidney stones are solid, rock-like deposits that form inside of the kidneys.  Kidney stones can cause nausea, vomiting, blood in the urine, abdominal pain, and the urge to urinate frequently.  Treatment for kidney stones depends on the size, location, and makeup of the stones. Kidney stones will often pass out of the body  through urination.  Kidney stones can be prevented by drinking enough fluids, eating a healthy diet, and maintaining a healthy weight. This information is not intended to replace advice given to you by your health care provider. Make sure you discuss any questions you have with your health care provider. Document Revised: 07/10/2018 Document Reviewed: 07/10/2018 Elsevier Patient Education  2021 Elsevier Inc.  

## 2020-08-10 NOTE — Progress Notes (Signed)
Urological Symptom Review  Patient is experiencing the following symptoms: Frequent urination Get up at night to urinate Leakage of urine Kidney Stones  Review of Systems  Gastrointestinal (upper)  : Negative for upper GI symptoms  Gastrointestinal (lower) : Diarrhea  Constitutional : Night Sweats Fatigue  Skin: Negative for skin symptoms  Eyes: Blurred vision  Ear/Nose/Throat : Sinus problems  Hematologic/Lymphatic: Negative for Hematologic/Lymphatic symptoms  Cardiovascular : Leg swelling  Respiratory : Cough Shortness of breath  Endocrine: Negative for endocrine symptoms  Musculoskeletal: Back pain  Neurological: Negative for neurological symptoms  Psychologic: Negative for psychiatric symptoms

## 2020-08-12 ENCOUNTER — Telehealth: Payer: Self-pay | Admitting: Cardiology

## 2020-08-12 NOTE — Telephone Encounter (Signed)
Per daughter, patient has swelling in ankles and feet & SOB that started last week. Also reports, that PCP stopped furosemide and started hctz 12.5 mg in which patient never started. Daughter reports giving patient furosemide 20 mg daily for 3 days and symptoms improved. Advised that she needed to take hctz as directed which is also a fluid pill. Also reports SOB, cough and congestion with wheezing. Denies dizziness or chest pain. Reports weight is unchanged. Reports eating and drinking okay. Daughter has been giving patient guaifenesin 600 mg one daily since yesterday for chest tightness. Advised to take hctz as prescribed, weigh daily, continue monitoring symptoms and contact PCP about cough and congestion. Advised to contact office next week if symptoms don't improve and if symptoms get worse, to take patient to the ED for an evaluation. Verbalized understanding of plan.

## 2020-08-12 NOTE — Telephone Encounter (Signed)
Per phone call from pt's daughter Selena Batten - pt is swelling in her ankles and feet, having SOB and wheezing  Please call [phone]

## 2020-08-25 ENCOUNTER — Telehealth: Payer: Self-pay | Admitting: Cardiology

## 2020-08-25 NOTE — Telephone Encounter (Signed)
[name](patient's Daugter) called questioning a inhaler that Dr Burnell Blanks Fenton(former Cardiologist) had prescribed but states Dr. Wyline Mood removed back (in April 2021. She said her PCP-Dr. Linton Rump want to have her on 90 albuterol instead of pro air. She is concerned this will cause her AFIB.   Please call her back at .[phone]                   .

## 2020-08-25 NOTE — Telephone Encounter (Signed)
Patient's daughter informed and verbalized understanding of plan. 

## 2020-08-25 NOTE — Telephone Encounter (Signed)
Daughter is asking if albuterol inhaler prn is okay to use with other medications and is it safe to use with a-fib. Advised its okay to use on an as needed basis but message would be sent to [facility] for verification.

## 2020-08-25 NOTE — Telephone Encounter (Signed)
Ok to use on an as needed basis as a rescue inhaler

## 2020-10-20 ENCOUNTER — Ambulatory Visit: Payer: Medicare PPO | Admitting: Cardiology

## 2020-10-20 ENCOUNTER — Encounter: Payer: Self-pay | Admitting: *Deleted

## 2020-10-20 ENCOUNTER — Encounter: Payer: Self-pay | Admitting: Cardiology

## 2020-10-20 VITALS — BP 132/80 | HR 78 | Ht 62.0 in | Wt 191.2 lb

## 2020-10-20 DIAGNOSIS — R600 Localized edema: Secondary | ICD-10-CM | POA: Diagnosis not present

## 2020-10-20 DIAGNOSIS — I4891 Unspecified atrial fibrillation: Secondary | ICD-10-CM | POA: Diagnosis not present

## 2020-10-20 DIAGNOSIS — E782 Mixed hyperlipidemia: Secondary | ICD-10-CM

## 2020-10-20 MED ORDER — METOPROLOL TARTRATE 50 MG PO TABS: 50.0000 mg | ORAL_TABLET | Freq: Two times a day (BID) | ORAL | 3 refills | Status: DC

## 2020-10-20 MED ORDER — APIXABAN 5 MG PO TABS: 5.0000 mg | ORAL_TABLET | Freq: Two times a day (BID) | ORAL | 3 refills | Status: DC

## 2020-10-20 MED ORDER — APIXABAN 5 MG PO TABS
5.0000 mg | ORAL_TABLET | Freq: Two times a day (BID) | ORAL | 3 refills | Status: DC
Start: 2020-10-20 — End: 2020-10-20

## 2020-10-20 NOTE — Addendum Note (Signed)
Addended by: Lesle Chris on: 10/20/2020 10:39 AM   Modules accepted: Orders

## 2020-10-20 NOTE — Progress Notes (Signed)
Clinical Summary Michelle Ayala is a 80 y.o.female seen today for follow upof the following medical problems.    1. Afib/atypical aflutter - followed in afib clinic   - s/p TEE/DCCV 06/17/19 - low HR's after conversion, metoprolol was stopped - recurrent arrhythmia at 06/28/19 visit. Restarted on lopressor 50mg  bid, discussed amio at afib clinic but patient wanted to wait on starting    - no recent palpitations - compliant with meds - no issues one elqiuis    2.LE edema - has lasix 20mg  prn - home weights 187-189 lbs.  - limiting sodium intake - overall edema is controlled   3. Hyperlipidemia - labs followed by pcp - she is on vytorin  Past Medical History:  Diagnosis Date   Anxiety    Atrial fibrillation (HCC)    Heart disease    High cholesterol    Vertigo      Allergies  Allergen Reactions   Cinnamon Anaphylaxis   Shellfish Allergy Anaphylaxis   Poison Ivy Extract     Rash    Poison Oak Extract     Rash      Current Outpatient Medications  Medication Sig Dispense Refill   acetaminophen (TYLENOL) 500 MG tablet Take 500 mg by mouth every 6 (six) hours as needed for mild pain.     apixaban (ELIQUIS) 5 MG TABS tablet Take 1 tablet (5 mg total) by mouth 2 (two) times daily. 180 tablet 1   cholecalciferol (VITAMIN D) 25 MCG (1000 UNIT) tablet Take 1,000 Units by mouth daily.     ezetimibe-simvastatin (VYTORIN) 10-40 MG tablet Take 1 tablet by mouth daily.     hydrochlorothiazide (HYDRODIURIL) 12.5 MG tablet Take 12.5 mg by mouth daily.     meclizine (ANTIVERT) 25 MG tablet Take 1 tablet (25 mg total) by mouth 3 (three) times daily as needed for dizziness. 20 tablet 0   metoprolol tartrate (LOPRESSOR) 50 MG tablet Take 1 tablet (50 mg total) by mouth 2 (two) times daily. 180 tablet 1   ondansetron (ZOFRAN) 4 MG tablet Take 4 mg by mouth every 8 (eight) hours as needed for nausea or vomiting.     tamsulosin (FLOMAX) 0.4 MG CAPS capsule Take 0.4 mg by mouth  daily.     No current facility-administered medications for this visit.     Past Surgical History:  Procedure Laterality Date   BREAST SURGERY     cyst removal   BUBBLE STUDY  06/24/2019   Procedure: BUBBLE STUDY;  Surgeon: , MD;  Location: [facility] ENDOSCOPY;  Service: Cardiovascular;;   CARDIOVERSION N/A 06/24/2019   Procedure: CARDIOVERSION;  Surgeon: [facility], MD;  Location: [facility] ENDOSCOPY;  Service: Cardiovascular;  Laterality: N/A;   CESAREAN SECTION     TEE WITHOUT CARDIOVERSION N/A 06/24/2019   Procedure: TRANSESOPHAGEAL ECHOCARDIOGRAM (TEE);  Surgeon: [facility], MD;  Location: [facility] ENDOSCOPY;  Service: Cardiovascular;  Laterality: N/A;   TUBAL LIGATION       Allergies  Allergen Reactions   Cinnamon Anaphylaxis   Shellfish Allergy Anaphylaxis   Poison Ivy Extract     Rash    Poison Oak Extract     Rash       Family History  Problem Relation Age of Onset   Cancer Father    Cancer Mother    Urolithiasis Neg Hx    Lupus Neg Hx    Sudden death Neg Hx    Sickle cell trait Neg Hx  Prostate cancer Neg Hx    Clotting disorder Neg Hx      Social History Michelle Ayala reports that she has never smoked. She has never used smokeless tobacco. Michelle Ayala reports that she does not currently use alcohol.   Review of Systems CONSTITUTIONAL: No weight loss, fever, chills, weakness or fatigue.  HEENT: Eyes: No visual loss, blurred vision, double vision or yellow sclerae.No hearing loss, sneezing, congestion, runny nose or sore throat.  SKIN: No rash or itching.  CARDIOVASCULAR: per hpi RESPIRATORY: No shortness of breath, cough or sputum.  GASTROINTESTINAL: No anorexia, nausea, vomiting or diarrhea. No abdominal pain or blood.  GENITOURINARY: No burning on urination, no polyuria NEUROLOGICAL: No headache, dizziness, syncope, paralysis, ataxia, numbness or tingling in the extremities. No change in bowel or bladder control.  MUSCULOSKELETAL: No  muscle, back pain, joint pain or stiffness.  LYMPHATICS: No enlarged nodes. No history of splenectomy.  PSYCHIATRIC: No history of depression or anxiety.  ENDOCRINOLOGIC: No reports of sweating, cold or heat intolerance. No polyuria or polydipsia.  Marland Kitchen   Physical Examination Today's Vitals   10/20/20 0949  BP: 132/80  Pulse: 78  SpO2: 98%  Weight: 191 lb 3.2 oz (86.7 kg)  Height: 5\' 2"  (1.575 m)   Body mass index is 34.97 kg/m.  Gen: resting comfortably, no acute distress HEENT: no scleral icterus, pupils equal round and reactive, no palptable cervical adenopathy,  CV: irreg, no m/r/g no jvd Resp: Clear to auscultation bilaterally GI: abdomen is soft, non-tender, non-distended, normal bowel sounds, no hepatosplenomegaly MSK: extremities are warm, no edema.  Skin: warm, no rash Neuro:  no focal deficits Psych: appropriate affect   Diagnostic Studies  TEE 06/24/19 1. Left ventricular ejection fraction, by estimation, is 55 to 60%. The  left ventricle has normal function. The left ventricle has no regional  wall motion abnormalities.   2. Right ventricular systolic function is mildly reduced. The right  ventricular size is moderately enlarged.   3. Left atrial size was mild to moderately dilated. No left atrial/left  atrial appendage thrombus was detected. The LAA emptying velocity was 42 cm/s.   4. Right atrial size was mildly dilated.   5. Small pericardial effusion. The pericardial effusion is  circumferential. There is no evidence of cardiac tamponade.   6. The mitral valve is degenerative. Mild to moderate mitral valve  regurgitation.   7. Tricuspid valve regurgitation is mild to moderate.   8. The aortic valve is normal in structure. Aortic valve regurgitation is  trivial. No aortic stenosis is present.   9. There is mild (Grade II) atheroma plaque involving the transverse and descending aorta.  10. Evidence of atrial level shunting detected by color flow Doppler.   Agitated saline contrast bubble study was positive with shunting observed within 3-6 cardiac cycles suggestive of interatrial shunt. 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   Conclusion(s)/Recommendation(s): No LA/LAA thrombus identified. Successful  cardioversion performed with restoration of normal sinus rhythm.        Assessment and Plan  1. Afib/aflutter - no symptoms, continue current meds including eliquis  2. LE edema - controlled with just prn lasix 20mg , continue  3. Hyperlipidemia - request pcp labs, continue vytorin   F/u 6 months   06/26/19, M.D.

## 2020-10-20 NOTE — Patient Instructions (Addendum)
Medication Instructions:  Continue all current medications.   Labwork: none  Testing/Procedures: none  Follow-Up: 6 months   Any Other Special Instructions Will Be Listed Below (If Applicable).   If you need a refill on your cardiac medications before your next appointment, please call your pharmacy.  

## 2020-11-20 ENCOUNTER — Ambulatory Visit (HOSPITAL_COMMUNITY)
Admission: RE | Admit: 2020-11-20 | Discharge: 2020-11-20 | Disposition: A | Discharge status: Home / Self Care | Payer: Medicare PPO | Source: Ambulatory Visit | Attending: Urology | Admitting: Urology

## 2020-11-20 ENCOUNTER — Other Ambulatory Visit: Payer: Self-pay

## 2020-11-20 DIAGNOSIS — N200 Calculus of kidney: Secondary | ICD-10-CM | POA: Insufficient documentation

## 2020-11-23 ENCOUNTER — Ambulatory Visit: Payer: Medicare PPO | Admitting: Urology

## 2020-11-23 ENCOUNTER — Other Ambulatory Visit: Payer: Self-pay

## 2020-11-23 ENCOUNTER — Encounter: Payer: Self-pay | Admitting: Urology

## 2020-11-23 VITALS — BP 127/77 | HR 78

## 2020-11-23 DIAGNOSIS — N200 Calculus of kidney: Secondary | ICD-10-CM | POA: Diagnosis not present

## 2020-11-23 LAB — URINALYSIS, ROUTINE W REFLEX MICROSCOPIC
Bilirubin, UA: NEGATIVE
Glucose, UA: NEGATIVE
Ketones, UA: NEGATIVE
Nitrite, UA: NEGATIVE
Protein,UA: NEGATIVE
Specific Gravity, UA: 1.025 (ref 1.005–1.030)
Urobilinogen, Ur: 1 mg/dL (ref 0.2–1.0)
pH, UA: 5.5 (ref 5.0–7.5)

## 2020-11-23 NOTE — Progress Notes (Signed)
Urological Symptom Review  Patient is experiencing the following symptoms: Frequent urination Hard to postpone urination Get up at night to urinate Leakage of urine Kidney stones   Review of Systems  Gastrointestinal (upper)  : Negative for upper GI symptoms  Gastrointestinal (lower) : Negative for lower GI symptoms  Constitutional : Night Sweats Fatigue  Skin: Negative for skin symptoms  Eyes: Negative for eye symptoms  Ear/Nose/Throat : Sinus problems  Hematologic/Lymphatic: Negative for Hematologic/Lymphatic symptoms  Cardiovascular : Negative for cardiovascular symptoms  Respiratory : Shortness of breath  Endocrine: Negative for endocrine symptoms  Musculoskeletal: Back pain  Neurological: Negative for neurological symptoms  Psychologic: Negative for psychiatric symptoms

## 2020-11-23 NOTE — Progress Notes (Signed)
11/23/2020 9:42 AM   Michelle Ayala [birth year] [id number]  Referring provider: Tanna Furry, MD [address] [place],  [place] [address]  No chief complaint on file.   HPI:  1) kidney stones - she underwent a CT scan of the abdomen and pelvis 08/01/2020 for right lower quadrant pain.  This showed some mild right hydroureteronephrosis and perinephric fluid consistent with a passed stone.  There are no other right sided stone. There was a small 2 - 3 mm left lower pole stone. She passed the stone prior to the CT (nurse saw it in strainer or toilet). UA showed greater than 50 red blood cells per high-powered field. Follow-up ultrasound was done 08/02/2020 and the hydronephrosis resolved, left lower pole stone again visualized. UA clear on repeat and clear today.   KUB - 09/22 - no stone visualized    No prior h/o stones. She was rx'd HCTZ.    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   She is well today and has had no stone pain or gross hematuria.    PMH: Past Medical History:  Diagnosis Date   Anxiety    Atrial fibrillation (HCC)    Heart disease    High cholesterol    Vertigo     Surgical History: Past Surgical History:  Procedure Laterality Date   BREAST SURGERY     cyst removal   BUBBLE STUDY  06/24/2019   Procedure: BUBBLE STUDY;  Surgeon: Parke Poisson, MD;  Location: [facility] ENDOSCOPY;  Service: Cardiovascular;;   CARDIOVERSION N/A 06/24/2019   Procedure: CARDIOVERSION;  Surgeon: Parke Poisson, MD;  Location: [facility] ENDOSCOPY;  Service: Cardiovascular;  Laterality: N/A;   CESAREAN SECTION     TEE WITHOUT CARDIOVERSION N/A 06/24/2019   Procedure: TRANSESOPHAGEAL ECHOCARDIOGRAM (TEE);  Surgeon: Parke Poisson, MD;  Location: [facility] ENDOSCOPY;  Service: Cardiovascular;  Laterality: N/A;   TUBAL LIGATION      Home Medications:  Allergies as of 11/23/2020       Reactions   Cinnamon Anaphylaxis   Shellfish Allergy Anaphylaxis   Poison Ivy Extract    Rash     Poison Oak Extract    Rash         Medication List        Accurate as of November 23, 2020  9:42 AM. If you have any questions, ask your nurse or doctor.          acetaminophen 500 MG tablet Commonly known as: TYLENOL Take 500 mg by mouth every 6 (six) hours as needed for mild pain.   apixaban 5 MG Tabs tablet Commonly known as: ELIQUIS Take 1 tablet (5 mg total) by mouth 2 (two) times daily.   cholecalciferol 25 MCG (1000 UNIT) tablet Commonly known as: VITAMIN D Take 1,000 Units by mouth daily.   ezetimibe-simvastatin 10-40 MG tablet Commonly known as: VYTORIN Take 1 tablet by mouth daily.   furosemide 20 MG tablet Commonly known as: LASIX Take 20 mg by mouth as needed.   guaiFENesin 600 MG 12 hr tablet Commonly known as: MUCINEX Take 600 mg by mouth as needed.   meclizine 25 MG tablet Commonly known as: Antivert Take 1 tablet (25 mg total) by mouth 3 (three) times daily as needed for dizziness.   metoprolol tartrate 50 MG tablet Commonly known as: LOPRESSOR Take 1 tablet (50 mg total) by mouth 2 (two) times daily.   ondansetron 4 MG tablet Commonly known as: ZOFRAN Take 4 mg by  mouth every 8 (eight) hours as needed for nausea or vomiting.   tamsulosin 0.4 MG Caps capsule Commonly known as: FLOMAX Take 0.4 mg by mouth as needed.        Allergies:  Allergies  Allergen Reactions   Cinnamon Anaphylaxis   Shellfish Allergy Anaphylaxis   Poison Ivy Extract     Rash    Poison Oak Extract     Rash     Family History: Family History  Problem Relation Age of Onset   Cancer Father    Cancer Mother    Urolithiasis Neg Hx    Lupus Neg Hx    Sudden death Neg Hx    Sickle cell trait Neg Hx    Prostate cancer Neg Hx    Clotting disorder Neg Hx     Social History:  reports that she has never smoked. She has never used smokeless tobacco. She reports that she does not currently use alcohol. She reports that she does not use drugs.   Physical  Exam: BP 127/77   Pulse 78   Constitutional:  Alert and oriented, No acute distress. HEENT: [place] AT, moist mucus membranes.  Trachea midline, no masses. Cardiovascular: No clubbing, cyanosis, or edema. Respiratory: Normal respiratory effort, no increased work of breathing. GI: Abdomen is soft, nontender, nondistended, no abdominal masses GU: No CVA tenderness Skin: No rashes, bruises or suspicious lesions. Neurologic: Grossly intact, no focal deficits, moving all 4 extremities. Psychiatric: Normal mood and affect.  Laboratory Data: Lab Results  Component Value Date   WBC 9.8 08/02/2020   HGB 10.8 (L) 08/02/2020   HCT 34.8 (L) 08/02/2020   MCV 98.9 08/02/2020   PLT 154 08/02/2020    Lab Results  Component Value Date   CREATININE 0.95 08/02/2020    No results found for: PSA  No results found for: TESTOSTERONE  No results found for: HGBA1C  Urinalysis    Component Value Date/Time   COLORURINE AMBER (A) 08/01/2020 0852   APPEARANCEUR Clear 08/10/2020 0910   LABSPEC 1.016 08/01/2020 0852   PHURINE 5.0 08/01/2020 0852   GLUCOSEU Negative 08/10/2020 0910   HGBUR LARGE (A) 08/01/2020 0852   BILIRUBINUR Negative 08/10/2020 0910   KETONESUR NEGATIVE 08/01/2020 0852   PROTEINUR 1+ (A) 08/10/2020 0910   PROTEINUR 100 (A) 08/01/2020 0852   NITRITE Negative 08/10/2020 0910   NITRITE NEGATIVE 08/01/2020 0852   LEUKOCYTESUR Negative 08/10/2020 0910   LEUKOCYTESUR NEGATIVE 08/01/2020 0852    Lab Results  Component Value Date   LABMICR Comment 08/10/2020   BACTERIA RARE (A) 08/01/2020    Pertinent Imaging: CT scan  Results for orders placed during the hospital encounter of 11/20/20  DG Abd 1 View  Narrative CLINICAL DATA:  Left flank pain.  History of kidney stone.  EXAM: ABDOMEN - 1 VIEW  COMPARISON:  CT of the abdomen pelvis dated 08/01/2020.  FINDINGS: Faint 3 mm radiopaque focus over the inferior aspect of the left flank may correspond to the kidney stone or  represent stool content within the descending colon. No other radiopaque calculi noted. There is no bowel dilatation or evidence of obstruction. No free air. Calcified uterine fibroid. Degenerative changes of the spine. No acute osseous pathology.  IMPRESSION: A 3 mm left renal calculus versus stool content within the descending colon.   Electronically Signed By: Elgie Collard M.D. On: 11/22/2020 14:16  No results found for this or any previous visit.  No results found for this or any previous visit.  No  results found for this or any previous visit.  Results for orders placed during the hospital encounter of 08/01/20  US RENAL  Narrative CLINICAL DATA:  80 year old female with right hydronephrosis on recent CT abdomen pelvis.  EXAM: RENAL / URINARY TRACT ULTRASOUND COMPLETE  COMPARISON:  CT abdomen pelvis from 08/01/2020  FINDINGS: Right Kidney:  Renal measurements: 12.7 x 5.0 x 5.9 cm = volume: 196 mL. Echogenicity within normal limits. No mass or hydronephrosis visualized.  Left Kidney:  Renal measurements: 11.7 x 5.7 x 5.0 cm = volume: 211 mL. Echogenicity within normal limits. Punctate inferior pole shadowing renal calculus is visualized. No mass or hydronephrosis visualized.  Bladder:  Appears normal for degree of bladder distention.  Other:  Simple cyst in the right lobe of the liver is again seen measuring up to 1.8 cm.  IMPRESSION: 1. No hydronephrosis. 2. Punctate, nonobstructive left inferior pole nephrolithiasis.   Electronically Signed By: Marliss Coots MD On: 08/02/2020 11:34  No results found for this or any previous visit.  No results found for this or any previous visit.  No results found for this or any previous visit.   Assessment & Plan:    1. Kidney stones Small stone LLP not seen on KUB. Disc diet changes to prevent stones. Check [place] in 1 year  - Urinalysis, Routine w reflex microscopic   No follow-ups on  file.  Jerilee Field, MD  [facility]  [address] [place], [place] [address] [phone]

## 2021-02-27 IMAGING — DX DG CHEST 2V
2 series · 2 of 2 positions shown · non-contrast
Comparison: No prior.

CLINICAL DATA: Palpitations.  Shortness of breath.

EXAM:
CHEST - 2 VIEW

[w chest pa]
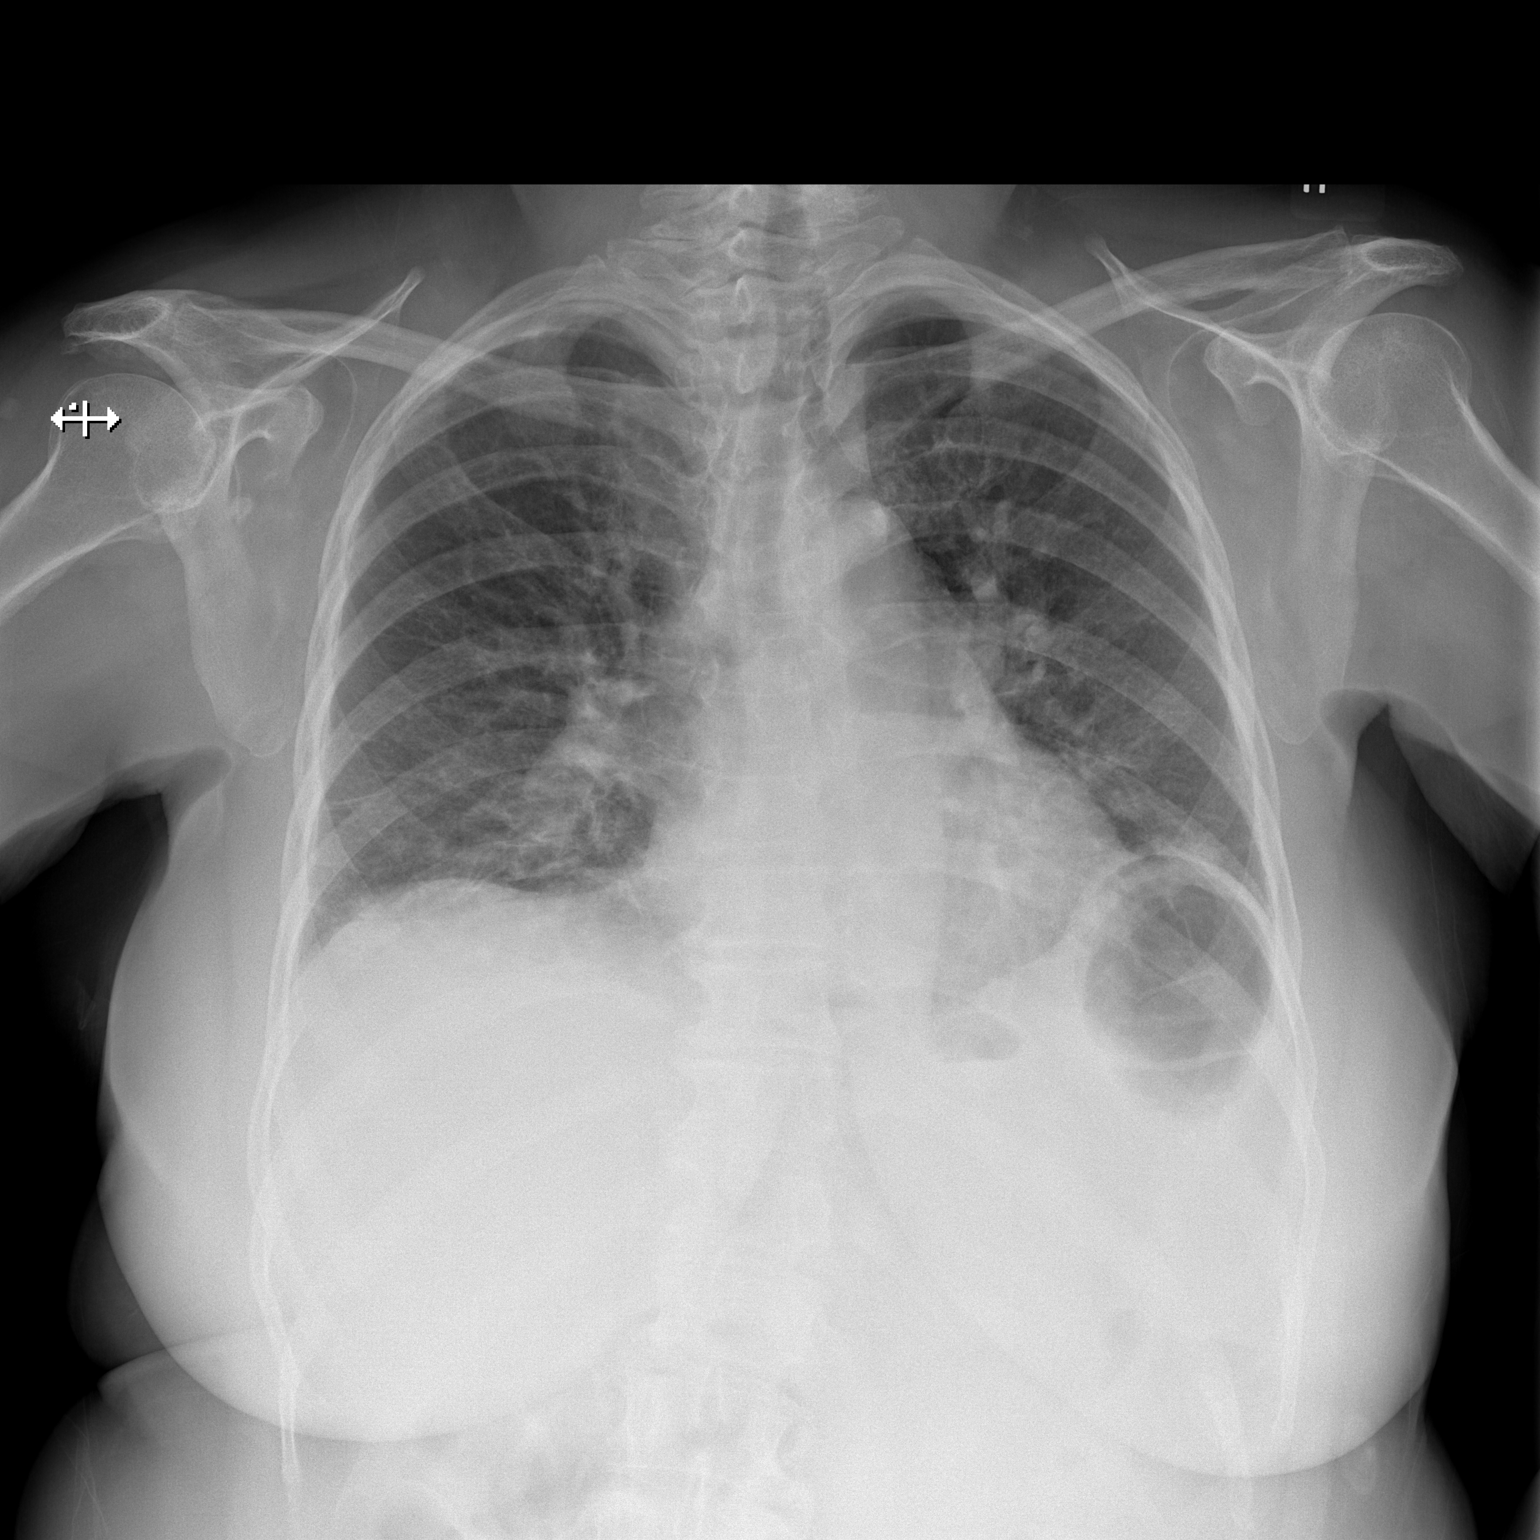

[w chest lat]
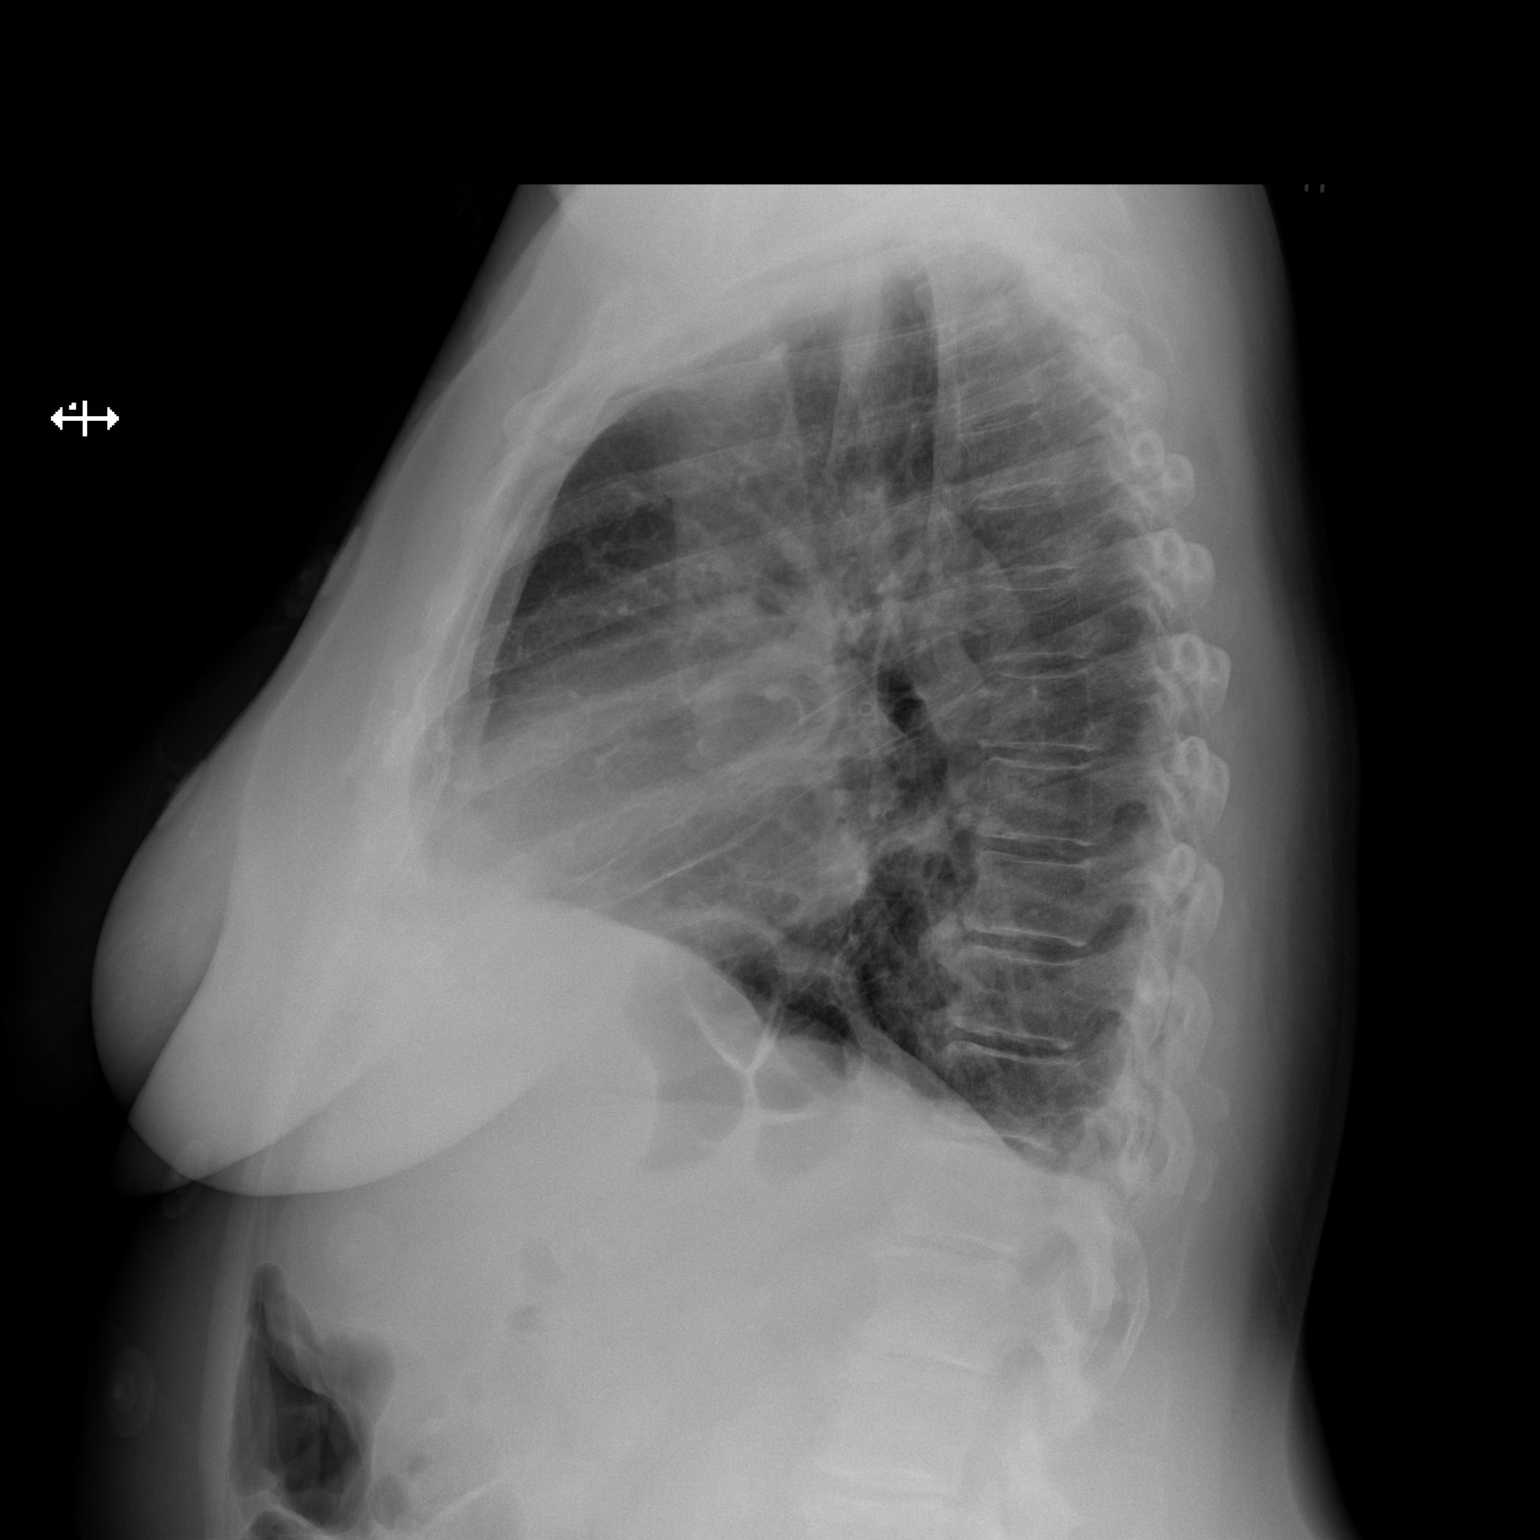

[2 of 2 positions shown; findings below may reference images not displayed]

FINDINGS: Mediastinum hilar structures normal. Heart size normal. Lung
volumes. Bilateral interstitial prominence consistent with
pneumonitis and or interstitial edema. No pleural effusion or
pneumothorax. Degenerative change thoracic spine.
IMPRESSION: Low lung volumes with bilateral interstitial prominence consistent
with pneumonitis and or interstitial edema.

## 2021-03-10 ENCOUNTER — Telehealth: Payer: Self-pay | Admitting: Cardiology

## 2021-03-10 NOTE — Telephone Encounter (Signed)
Pt c/o medication issue:  1. Name of Medication:  apixaban (ELIQUIS) 5 MG TABS tablet  2. How are you currently taking this medication (dosage and times per day)? As prescribed  3. Are you having a reaction (difficulty breathing--STAT)? No   4. What is your medication issue? Selena Batten is calling stating her mother had a fall last night at 12:30 AM where she hit her head. Jorryn had a headache in the back of her head last night after the fall that has since went away. This morning she has a knot on her head and light bruising on the inside of her arm. She iced the knot last night and this morning, Selena Batten also gave her a tylenol today. She is wanting to know if Adlyn's Eliquis needs to be help due to this fall. Please advise.

## 2021-03-10 NOTE — Telephone Encounter (Signed)
Spoke with daughter Michelle Ayala.  Patient has not had any adverse effect from Fall last night.  Pt has not had any change in LOC, no confusion, pupils equal per Michelle Ayala.  Small goose egg above eye but no bruising.  Michelle Ayala states she called PCP and they are talking about taking her off Eliquis.  To [name] to continue to monitor pt.  If no change in LOC continue Eliquis per schedule until she hears back from PCP.  Pt does not have a history of falls.  This was the first episode and she just tripped.  [name] to call back if PCP recommends stopping Eliquis to get Dr Nelly Laurence input.   She verbalized understanding and agreement with plan.  Appreciative of call.

## 2021-04-27 ENCOUNTER — Ambulatory Visit: Payer: Medicare PPO | Admitting: Cardiology

## 2021-04-27 ENCOUNTER — Encounter: Payer: Self-pay | Admitting: Cardiology

## 2021-04-27 VITALS — BP 132/84 | HR 76 | Ht 62.0 in | Wt 195.8 lb

## 2021-04-27 DIAGNOSIS — E782 Mixed hyperlipidemia: Secondary | ICD-10-CM | POA: Diagnosis not present

## 2021-04-27 DIAGNOSIS — R600 Localized edema: Secondary | ICD-10-CM

## 2021-04-27 DIAGNOSIS — I4891 Unspecified atrial fibrillation: Secondary | ICD-10-CM | POA: Diagnosis not present

## 2021-04-27 NOTE — Patient Instructions (Signed)

## 2021-04-27 NOTE — Progress Notes (Signed)
Clinical Summary Michelle Ayala is a 81 y.o.female seen today for follow upof the following medical problems.    1. Afib/atypical aflutter - followed in afib clinic   - s/p TEE/DCCV 06/17/19 - low HR's after conversion, metoprolol was stopped - recurrent arrhythmia at 06/28/19 visit. Restarted on lopressor 50mg  bid, discussed amio at afib clinic but patient wanted to wait on starting     - no recent palpitations - compliant with meds - no issues one elqiuis     Fall Jan 2023, external bruising that has resolved - occasional palpitations.    2.LE edema - has lasix 20mg  prn - home weights 187-189 lbs.  - limiting sodium intake - overall edema is controlled  - swelling overall controlled. - has prn lasix, does not take regularly.      3. Hyperlipidemia - labs followed by pcp - she is on vytorin  07/2020 TC 108 TC 144 HDL 44 LDL 80  4. Chest pain - episode of chest pain in January, 2 different days - left sided, "hurting" left side. 4/10 in severity. No other associated symptoms. Occurred while laying bed. Not positional.   5.SOB  Past Medical History:  Diagnosis Date   Anxiety    Atrial fibrillation (HCC)    Heart disease    High cholesterol    Vertigo      Allergies  Allergen Reactions   Cinnamon Anaphylaxis   Shellfish Allergy Anaphylaxis   Poison Ivy Extract     Rash    Poison Oak Extract     Rash      Current Outpatient Medications  Medication Sig Dispense Refill   acetaminophen (TYLENOL) 500 MG tablet Take 500 mg by mouth every 6 (six) hours as needed for mild pain.     apixaban (ELIQUIS) 5 MG TABS tablet Take 1 tablet (5 mg total) by mouth 2 (two) times daily. 180 tablet 3   cholecalciferol (VITAMIN D) 25 MCG (1000 UNIT) tablet Take 1,000 Units by mouth daily.     ezetimibe-simvastatin (VYTORIN) 10-40 MG tablet Take 1 tablet by mouth daily.     furosemide (LASIX) 20 MG tablet Take 20 mg by mouth as needed.     guaiFENesin (MUCINEX) 600 MG 12  hr tablet Take 600 mg by mouth as needed.     meclizine (ANTIVERT) 25 MG tablet Take 1 tablet (25 mg total) by mouth 3 (three) times daily as needed for dizziness. 20 tablet 0   metoprolol tartrate (LOPRESSOR) 50 MG tablet Take 1 tablet (50 mg total) by mouth 2 (two) times daily. 180 tablet 3   ondansetron (ZOFRAN) 4 MG tablet Take 4 mg by mouth every 8 (eight) hours as needed for nausea or vomiting.     tamsulosin (FLOMAX) 0.4 MG CAPS capsule Take 0.4 mg by mouth as needed.     No current facility-administered medications for this visit.     Past Surgical History:  Procedure Laterality Date   BREAST SURGERY     cyst removal   BUBBLE STUDY  06/24/2019   Procedure: BUBBLE STUDY;  Surgeon: Elouise Munroe, MD;  Location: [place];  Service: Cardiovascular;;   CARDIOVERSION N/A 06/24/2019   Procedure: CARDIOVERSION;  Surgeon: Elouise Munroe, MD;  Location: [place];  Service: Cardiovascular;  Laterality: N/A;   CESAREAN SECTION     TEE WITHOUT CARDIOVERSION N/A 06/24/2019   Procedure: TRANSESOPHAGEAL ECHOCARDIOGRAM (TEE);  Surgeon: Elouise Munroe, MD;  Location: [place];  Service: Cardiovascular;  Laterality:  N/A;   TUBAL LIGATION       Allergies  Allergen Reactions   Cinnamon Anaphylaxis   Shellfish Allergy Anaphylaxis   Poison Ivy Extract     Rash    Poison Oak Extract     Rash       Family History  Problem Relation Age of Onset   Cancer Father    Cancer Mother    Urolithiasis Neg Hx    Lupus Neg Hx    Sudden death Neg Hx    Sickle cell trait Neg Hx    Prostate cancer Neg Hx    Clotting disorder Neg Hx      Social History Michelle Ayala reports that she has never smoked. She has never used smokeless tobacco. Michelle Ayala reports that she does not currently use alcohol.   Review of Systems CONSTITUTIONAL: No weight loss, fever, chills, weakness or fatigue.  HEENT: Eyes: No visual loss, blurred vision, double vision or yellow sclerae.No hearing  loss, sneezing, congestion, runny nose or sore throat.  SKIN: No rash or itching.  CARDIOVASCULAR: per hpi RESPIRATORY: per hpi GASTROINTESTINAL: No anorexia, nausea, vomiting or diarrhea. No abdominal pain or blood.  GENITOURINARY: No burning on urination, no polyuria NEUROLOGICAL: No headache, dizziness, syncope, paralysis, ataxia, numbness or tingling in the extremities. No change in bowel or bladder control.  MUSCULOSKELETAL: No muscle, back pain, joint pain or stiffness.  LYMPHATICS: No enlarged nodes. No history of splenectomy.  PSYCHIATRIC: No history of depression or anxiety.  ENDOCRINOLOGIC: No reports of sweating, cold or heat intolerance. No polyuria or polydipsia.  Marland Kitchen   Physical Examination Today's Vitals   04/27/21 1105  BP: 132/84  Pulse: 76  SpO2: 98%  Weight: 195 lb 12.8 oz (88.8 kg)  Height: 5\' 2"  (1.575 m)   Body mass index is 35.81 kg/m.  Gen: resting comfortably, no acute distress HEENT: no scleral icterus, pupils equal round and reactive, no palptable cervical adenopathy,  CV: irreg, no m/r/g no jvd Resp: Clear to auscultation bilaterally GI: abdomen is soft, non-tender, non-distended, normal bowel sounds, no hepatosplenomegaly MSK: extremities are warm, no edema.  Skin: warm, no rash Neuro:  no focal deficits Psych: appropriate affect   Diagnostic Studies    TEE 06/24/19 1. Left ventricular ejection fraction, by estimation, is 55 to 60%. The  left ventricle has normal function. The left ventricle has no regional  wall motion abnormalities.   2. Right ventricular systolic function is mildly reduced. The right  ventricular size is moderately enlarged.   3. Left atrial size was mild to moderately dilated. No left atrial/left  atrial appendage thrombus was detected. The LAA emptying velocity was 42 cm/s.   4. Right atrial size was mildly dilated.   5. Small pericardial effusion. The pericardial effusion is  circumferential. There is no evidence of  cardiac tamponade.   6. The mitral valve is degenerative. Mild to moderate mitral valve  regurgitation.   7. Tricuspid valve regurgitation is mild to moderate.   8. The aortic valve is normal in structure. Aortic valve regurgitation is  trivial. No aortic stenosis is present.   9. There is mild (Grade II) atheroma plaque involving the transverse and descending aorta.  10. Evidence of atrial level shunting detected by color flow Doppler.  Agitated saline contrast bubble study was positive with shunting observed within 3-6 cardiac cycles suggestive of interatrial shunt. 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   Conclusion(s)/Recommendation(s): No LA/LAA thrombus identified.  Successful  cardioversion performed with restoration of normal sinus rhythm.    Assessment and Plan   1. Afib/aflutter - denies symptomss, continue current meds   2. LE edema - contine well, continue prn lasix  3. Hyperlipidemia -LDL is at goal, continue current meds      Arnoldo Lenis, M.D.

## 2021-06-28 ENCOUNTER — Encounter: Payer: Self-pay | Admitting: Cardiology

## 2021-11-01 ENCOUNTER — Ambulatory Visit: Payer: Medicare PPO | Attending: Cardiology | Admitting: Cardiology

## 2021-11-01 ENCOUNTER — Encounter: Payer: Self-pay | Admitting: Cardiology

## 2021-11-01 VITALS — BP 128/82 | HR 75 | Ht 63.0 in | Wt 194.8 lb

## 2021-11-01 DIAGNOSIS — R600 Localized edema: Secondary | ICD-10-CM | POA: Diagnosis not present

## 2021-11-01 DIAGNOSIS — D6869 Other thrombophilia: Secondary | ICD-10-CM

## 2021-11-01 DIAGNOSIS — I4891 Unspecified atrial fibrillation: Secondary | ICD-10-CM | POA: Diagnosis not present

## 2021-11-01 DIAGNOSIS — E782 Mixed hyperlipidemia: Secondary | ICD-10-CM

## 2021-11-01 MED ORDER — METOPROLOL TARTRATE 50 MG PO TABS: 50.0000 mg | ORAL_TABLET | Freq: Two times a day (BID) | ORAL | 3 refills | Status: DC

## 2021-11-01 MED ORDER — APIXABAN 5 MG PO TABS: 5.0000 mg | ORAL_TABLET | Freq: Two times a day (BID) | ORAL | 3 refills | Status: DC

## 2021-11-01 NOTE — Progress Notes (Signed)
Clinical Summary Ms. Cremeens is a 81 y.o.female seen today for follow upof the following medical problems.    1. Afib/atypical aflutter - followed in afib clinic   - s/p TEE/DCCV 06/17/19 - low HR's after conversion, metoprolol was stopped - recurrent arrhythmia at 06/28/19 visit. Restarted on lopressor 50mg  bid, discussed amio at afib clinic but patient wanted to wait on starting   - occasional palpitations at night, short in duration - no bleeding on eliquis.     2.LE edema - has lasix 20mg  prn - home weights 187-189 lbs.  - limiting sodium intake - overall edema is controlled   - has prn lasix, takes 1-2 times per month      3. Hyperlipidemia - labs followed by pcp - she is on vytorin   07/2020 TC 108 TC 144 HDL 44 LDL 80 - more recent labs with pcp      Past Medical History:  Diagnosis Date   Anxiety    Atrial fibrillation (HCC)    Heart disease    High cholesterol    Vertigo      Allergies  Allergen Reactions   Cinnamon Anaphylaxis   Shellfish Allergy Anaphylaxis   Poison Ivy Extract     Rash    Poison Oak Extract     Rash      Current Outpatient Medications  Medication Sig Dispense Refill   acetaminophen (TYLENOL) 500 MG tablet Take 500 mg by mouth every 6 (six) hours as needed for mild pain.     apixaban (ELIQUIS) 5 MG TABS tablet Take 1 tablet (5 mg total) by mouth 2 (two) times daily. 180 tablet 3   cholecalciferol (VITAMIN D) 25 MCG (1000 UNIT) tablet Take 1,000 Units by mouth daily.     ezetimibe-simvastatin (VYTORIN) 10-40 MG tablet Take 1 tablet by mouth daily.     furosemide (LASIX) 20 MG tablet Take 20 mg by mouth as needed.     guaiFENesin (MUCINEX) 600 MG 12 hr tablet Take 600 mg by mouth as needed.     meclizine (ANTIVERT) 25 MG tablet Take 1 tablet (25 mg total) by mouth 3 (three) times daily as needed for dizziness. 20 tablet 0   metoprolol tartrate (LOPRESSOR) 50 MG tablet Take 1 tablet (50 mg total) by mouth 2 (two) times  daily. 180 tablet 3   ondansetron (ZOFRAN) 4 MG tablet Take 4 mg by mouth every 8 (eight) hours as needed for nausea or vomiting.     No current facility-administered medications for this visit.     Past Surgical History:  Procedure Laterality Date   BREAST SURGERY     cyst removal   BUBBLE STUDY  06/24/2019   Procedure: BUBBLE STUDY;  Surgeon: 08/2020, MD;  Location: [facility] ENDOSCOPY;  Service: Cardiovascular;;   CARDIOVERSION N/A 06/24/2019   Procedure: CARDIOVERSION;  Surgeon: [facility], MD;  Location: [facility] ENDOSCOPY;  Service: Cardiovascular;  Laterality: N/A;   CESAREAN SECTION     TEE WITHOUT CARDIOVERSION N/A 06/24/2019   Procedure: TRANSESOPHAGEAL ECHOCARDIOGRAM (TEE);  Surgeon: [facility], MD;  Location: [facility] ENDOSCOPY;  Service: Cardiovascular;  Laterality: N/A;   TUBAL LIGATION       Allergies  Allergen Reactions   Cinnamon Anaphylaxis   Shellfish Allergy Anaphylaxis   Poison Ivy Extract     Rash    Poison Oak Extract     Rash       Family History  Problem Relation Age of Onset  Cancer Father    Cancer Mother    Urolithiasis Neg Hx    Lupus Neg Hx    Sudden death Neg Hx    Sickle cell trait Neg Hx    Prostate cancer Neg Hx    Clotting disorder Neg Hx      Social History Ms. Sperbeck reports that she has never smoked. She has never used smokeless tobacco. Ms. Etzkorn reports that she does not currently use alcohol.   Review of Systems CONSTITUTIONAL: No weight loss, fever, chills, weakness or fatigue.  HEENT: Eyes: No visual loss, blurred vision, double vision or yellow sclerae.No hearing loss, sneezing, congestion, runny nose or sore throat.  SKIN: No rash or itching.  CARDIOVASCULAR: per hpi RESPIRATORY: No shortness of breath, cough or sputum.  GASTROINTESTINAL: No anorexia, nausea, vomiting or diarrhea. No abdominal pain or blood.  GENITOURINARY: No burning on urination, no polyuria NEUROLOGICAL: No headache, dizziness, syncope,  paralysis, ataxia, numbness or tingling in the extremities. No change in bowel or bladder control.  MUSCULOSKELETAL: No muscle, back pain, joint pain or stiffness.  LYMPHATICS: No enlarged nodes. No history of splenectomy.  PSYCHIATRIC: No history of depression or anxiety.  ENDOCRINOLOGIC: No reports of sweating, cold or heat intolerance. No polyuria or polydipsia.  Marland Kitchen   Physical Examination Today's Vitals   11/01/21 1051  BP: 128/82  Pulse: 75  SpO2: 97%  Weight: 194 lb 12.8 oz (88.4 kg)  Height: 5\' 3"  (1.6 m)   Body mass index is 34.51 kg/m.  Gen: resting comfortably, no acute distress HEENT: no scleral icterus, pupils equal round and reactive, no palptable cervical adenopathy,  CV: irreg, no m/r/g no jvd Resp: Clear to auscultation bilaterally GI: abdomen is soft, non-tender, non-distended, normal bowel sounds, no hepatosplenomegaly MSK: extremities are warm, no edema.  Skin: warm, no rash Neuro:  no focal deficits Psych: appropriate affect   Diagnostic Studies    TEE 06/24/19 1. Left ventricular ejection fraction, by estimation, is 55 to 60%. The  left ventricle has normal function. The left ventricle has no regional  wall motion abnormalities.   2. Right ventricular systolic function is mildly reduced. The right  ventricular size is moderately enlarged.   3. Left atrial size was mild to moderately dilated. No left atrial/left  atrial appendage thrombus was detected. The LAA emptying velocity was 42 cm/s.   4. Right atrial size was mildly dilated.   5. Small pericardial effusion. The pericardial effusion is  circumferential. There is no evidence of cardiac tamponade.   6. The mitral valve is degenerative. Mild to moderate mitral valve  regurgitation.   7. Tricuspid valve regurgitation is mild to moderate.   8. The aortic valve is normal in structure. Aortic valve regurgitation is  trivial. No aortic stenosis is present.   9. There is mild (Grade II) atheroma plaque  involving the transverse and descending aorta.  10. Evidence of atrial level shunting detected by color flow Doppler.  Agitated saline contrast bubble study was positive with shunting observed within 3-6 cardiac cycles suggestive of interatrial shunt. 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   Conclusion(s)/Recommendation(s): No LA/LAA thrombus identified. Successful  cardioversion performed with restoration of normal sinus rhythm.      Assessment and Plan  1. Afib/aflutter/acquired thrombophilia - rate controlled, overall doing well - continue current meds including eliquis for stroke prevention - EKG today shows rate controlled afib   2. LE edema - controlled, continue prn diuretic  3. Hyperlipidemia -request labs from pcp, continue vytorin        Antoine Poche, M.D.

## 2021-11-01 NOTE — Patient Instructions (Signed)
Medication Instructions:  Continue all current medications.   Labwork: none  Testing/Procedures: none  Follow-Up: 6 months   Any Other Special Instructions Will Be Listed Below (If Applicable).   If you need a refill on your cardiac medications before your next appointment, please call your pharmacy.  

## 2021-11-05 ENCOUNTER — Encounter: Payer: Self-pay | Admitting: *Deleted

## 2022-04-29 ENCOUNTER — Encounter: Payer: Self-pay | Admitting: Cardiology

## 2022-05-12 ENCOUNTER — Telehealth: Payer: Self-pay | Admitting: Cardiology

## 2022-05-12 NOTE — Telephone Encounter (Signed)
Patients daughter notified that we have received lab work from pcp's office. Daughter had no further questions at this time.

## 2022-05-12 NOTE — Telephone Encounter (Signed)
Pt's daughter calling to f/u on whether pt's PCP sent lab results over. Please advise

## 2022-05-17 ENCOUNTER — Ambulatory Visit: Payer: Medicare PPO | Admitting: Cardiology

## 2022-05-17 NOTE — Progress Notes (Deleted)
Clinical Summary [name] is a 82 y.o.female  seen today for follow upof the following medical problems.    1. Afib/atypical aflutter - followed in afib clinic   - s/p TEE/DCCV 06/17/19 - low HR's after conversion, metoprolol was stopped - recurrent arrhythmia at 06/28/19 visit. Restarted on lopressor '50mg'$  bid, discussed amio at afib clinic but patient wanted to wait on starting   - occasional palpitations at night, short in duration - no bleeding on eliquis.        2.LE edema - has lasix '20mg'$  prn - home weights 187-189 lbs.  - limiting sodium intake - overall edema is controlled   - has prn lasix, takes 1-2 times per month       3. Hyperlipidemia - labs followed by pcp - she is on vytorin   07/2020 TC 108 TC 144 HDL 44 LDL 80 - more recent labs with pcp Past Medical History:  Diagnosis Date   Anxiety    Atrial fibrillation (HCC)    Heart disease    High cholesterol    Vertigo      Allergies  Allergen Reactions   Cinnamon Anaphylaxis   Shellfish Allergy Anaphylaxis   Poison Ivy Extract     Rash    Poison Oak Extract     Rash      Current Outpatient Medications  Medication Sig Dispense Refill   acetaminophen (TYLENOL) 500 MG tablet Take 500 mg by mouth every 6 (six) hours as needed for mild pain.     apixaban (ELIQUIS) 5 MG TABS tablet Take 1 tablet (5 mg total) by mouth 2 (two) times daily. 180 tablet 3   cholecalciferol (VITAMIN D) 25 MCG (1000 UNIT) tablet Take 1,000 Units by mouth daily.     ezetimibe-simvastatin (VYTORIN) 10-40 MG tablet Take 1 tablet by mouth daily.     furosemide (LASIX) 20 MG tablet Take 20 mg by mouth as needed.     guaiFENesin (MUCINEX) 600 MG 12 hr tablet Take 600 mg by mouth as needed.     meclizine (ANTIVERT) 25 MG tablet Take 1 tablet (25 mg total) by mouth 3 (three) times daily as needed for dizziness. 20 tablet 0   metoprolol tartrate (LOPRESSOR) 50 MG tablet Take 1 tablet (50 mg total) by mouth 2 (two) times  daily. 180 tablet 3   ondansetron (ZOFRAN) 4 MG tablet Take 4 mg by mouth every 8 (eight) hours as needed for nausea or vomiting.     No current facility-administered medications for this visit.     Past Surgical History:  Procedure Laterality Date   BREAST SURGERY     cyst removal   BUBBLE STUDY  06/24/2019   Procedure: BUBBLE STUDY;  Surgeon: Elouise Munroe, MD;  Location: [place];  Service: Cardiovascular;;   CARDIOVERSION N/A 06/24/2019   Procedure: CARDIOVERSION;  Surgeon: Elouise Munroe, MD;  Location: [place];  Service: Cardiovascular;  Laterality: N/A;   CESAREAN SECTION     TEE WITHOUT CARDIOVERSION N/A 06/24/2019   Procedure: TRANSESOPHAGEAL ECHOCARDIOGRAM (TEE);  Surgeon: Elouise Munroe, MD;  Location: [facility] ENDOSCOPY;  Service: Cardiovascular;  Laterality: N/A;   TUBAL LIGATION       Allergies  Allergen Reactions   Cinnamon Anaphylaxis   Shellfish Allergy Anaphylaxis   Poison Ivy Extract     Rash    Poison Oak Extract     Rash       Family History  Problem Relation Age of Onset  Cancer Father    Cancer Mother    Urolithiasis Neg Hx    Lupus Neg Hx    Sudden death Neg Hx    Sickle cell trait Neg Hx    Prostate cancer Neg Hx    Clotting disorder Neg Hx      Social History Michelle Ayala reports that she has never smoked. She has never used smokeless tobacco. Michelle Ayala reports that she does not currently use alcohol.   Review of Systems CONSTITUTIONAL: No weight loss, fever, chills, weakness or fatigue.  HEENT: Eyes: No visual loss, blurred vision, double vision or yellow sclerae.No hearing loss, sneezing, congestion, runny nose or sore throat.  SKIN: No rash or itching.  CARDIOVASCULAR:  RESPIRATORY: No shortness of breath, cough or sputum.  GASTROINTESTINAL: No anorexia, nausea, vomiting or diarrhea. No abdominal pain or blood.  GENITOURINARY: No burning on urination, no polyuria NEUROLOGICAL: No headache, dizziness, syncope,  paralysis, ataxia, numbness or tingling in the extremities. No change in bowel or bladder control.  MUSCULOSKELETAL: No muscle, back pain, joint pain or stiffness.  LYMPHATICS: No enlarged nodes. No history of splenectomy.  PSYCHIATRIC: No history of depression or anxiety.  ENDOCRINOLOGIC: No reports of sweating, cold or heat intolerance. No polyuria or polydipsia.  Marland Kitchen   Physical Examination There were no vitals filed for this visit. There were no vitals filed for this visit.  Gen: resting comfortably, no acute distress HEENT: no scleral icterus, pupils equal round and reactive, no palptable cervical adenopathy,  CV Resp: Clear to auscultation bilaterally GI: abdomen is soft, non-tender, non-distended, normal bowel sounds, no hepatosplenomegaly MSK: extremities are warm, no edema.  Skin: warm, no rash Neuro:  no focal deficits Psych: appropriate affect   Diagnostic Studies  TEE 06/24/19 1. Left ventricular ejection fraction, by estimation, is 55 to 60%. The  left ventricle has normal function. The left ventricle has no regional  wall motion abnormalities.   2. Right ventricular systolic function is mildly reduced. The right  ventricular size is moderately enlarged.   3. Left atrial size was mild to moderately dilated. No left atrial/left  atrial appendage thrombus was detected. The LAA emptying velocity was 42 cm/s.   4. Right atrial size was mildly dilated.   5. Small pericardial effusion. The pericardial effusion is  circumferential. There is no evidence of cardiac tamponade.   6. The mitral valve is degenerative. Mild to moderate mitral valve  regurgitation.   7. Tricuspid valve regurgitation is mild to moderate.   8. The aortic valve is normal in structure. Aortic valve regurgitation is  trivial. No aortic stenosis is present.   9. There is mild (Grade II) atheroma plaque involving the transverse and descending aorta.  10. Evidence of atrial level shunting detected by  color flow Doppler.  Agitated saline contrast bubble study was positive with shunting observed within 3-6 cardiac cycles suggestive of interatrial shunt. 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   Conclusion(s)/Recommendation(s): No LA/LAA thrombus identified. Successful  cardioversion performed with restoration of normal sinus rhythm.        Assessment and Plan   1. Afib/aflutter/acquired thrombophilia - rate controlled, overall doing well - continue current meds including eliquis for stroke prevention - EKG today shows rate controlled afib   2. LE edema - controlled, continue prn diuretic   3. Hyperlipidemia -request labs from pcp, continue vytorin     Arnoldo Lenis, M.D., F.A.C.C.

## 2022-06-09 ENCOUNTER — Encounter: Payer: Self-pay | Admitting: Cardiology

## 2022-06-09 ENCOUNTER — Ambulatory Visit: Payer: Medicare PPO | Attending: Cardiology | Admitting: Cardiology

## 2022-06-09 VITALS — BP 144/80 | HR 72 | Ht 63.0 in | Wt 197.0 lb

## 2022-06-09 DIAGNOSIS — R600 Localized edema: Secondary | ICD-10-CM

## 2022-06-09 DIAGNOSIS — I4891 Unspecified atrial fibrillation: Secondary | ICD-10-CM | POA: Diagnosis not present

## 2022-06-09 DIAGNOSIS — D6869 Other thrombophilia: Secondary | ICD-10-CM | POA: Diagnosis not present

## 2022-06-09 DIAGNOSIS — E782 Mixed hyperlipidemia: Secondary | ICD-10-CM

## 2022-06-09 MED ORDER — METOPROLOL TARTRATE 50 MG PO TABS: 50.0000 mg | ORAL_TABLET | Freq: Two times a day (BID) | ORAL | 3 refills | Status: DC

## 2022-06-09 MED ORDER — APIXABAN 5 MG PO TABS: 5.0000 mg | ORAL_TABLET | Freq: Two times a day (BID) | ORAL | 3 refills | Status: DC

## 2022-06-09 NOTE — Patient Instructions (Signed)
Medication Instructions:  Continue all current medications.   Labwork: none  Testing/Procedures: none  Follow-Up: 3 months   Any Other Special Instructions Will Be Listed Below (If Applicable). Please call the office on Monday with update on your blood pressure readings   If you need a refill on your cardiac medications before your next appointment, please call your pharmacy.

## 2022-06-09 NOTE — Progress Notes (Signed)
Clinical Summary Ms. Cheff is a 82 y.o.female seen today for follow upof the following medical problems.      1. Afib/atypical aflutter - followed in afib clinic   - s/p TEE/DCCV 06/17/19 - low HR's after conversion, metoprolol was stopped - recurrent arrhythmia at 06/28/19 visit. Restarted on lopressor 50mg  bid, discussed amio at afib clinic but patient wanted to wait on starting   - occasional palpitations at night, short in duration - no bleeding on eliquis.     - no recent palpitations - compliant with meds. No bleedig on eliquis     2.LE edema - has lasix 20mg  prn - home weights 187-189 lbs.  - limiting sodium intake - overall edema is controlled   - some swelling at times, resolves with lasix.     3. Hyperlipidemia - labs followed by pcp - she is on vytorin   07/2020 TC 108 TC 144 HDL 44 LDL 80 04/2022 TC 185 TG 155 HDL 56 LDL 104  - more recent labs with pcp   4.SOB - DOE with short distances - sedentary lifestyle, has gained 13 lbs oer the last 3 years  Past Medical History:  Diagnosis Date   Anxiety    Atrial fibrillation (HCC)    Heart disease    High cholesterol    Vertigo      Allergies  Allergen Reactions   Cinnamon Anaphylaxis   Shellfish Allergy Anaphylaxis   Poison Ivy Extract     Rash    Poison Oak Extract     Rash      Current Outpatient Medications  Medication Sig Dispense Refill   acetaminophen (TYLENOL) 500 MG tablet Take 500 mg by mouth every 6 (six) hours as needed for mild pain.     apixaban (ELIQUIS) 5 MG TABS tablet Take 1 tablet (5 mg total) by mouth 2 (two) times daily. 180 tablet 3   cholecalciferol (VITAMIN D) 25 MCG (1000 UNIT) tablet Take 1,000 Units by mouth daily.     ezetimibe-simvastatin (VYTORIN) 10-40 MG tablet Take 1 tablet by mouth daily.     furosemide (LASIX) 20 MG tablet Take 20 mg by mouth as needed.     guaiFENesin (MUCINEX) 600 MG 12 hr tablet Take 600 mg by mouth as needed.     meclizine  (ANTIVERT) 25 MG tablet Take 1 tablet (25 mg total) by mouth 3 (three) times daily as needed for dizziness. 20 tablet 0   metoprolol tartrate (LOPRESSOR) 50 MG tablet Take 1 tablet (50 mg total) by mouth 2 (two) times daily. 180 tablet 3   ondansetron (ZOFRAN) 4 MG tablet Take 4 mg by mouth every 8 (eight) hours as needed for nausea or vomiting.     No current facility-administered medications for this visit.     Past Surgical History:  Procedure Laterality Date   BREAST SURGERY     cyst removal   BUBBLE STUDY  06/24/2019   Procedure: BUBBLE STUDY;  Surgeon: Elouise Munroe, MD;  Location: [place];  Service: Cardiovascular;;   CARDIOVERSION N/A 06/24/2019   Procedure: CARDIOVERSION;  Surgeon: Elouise Munroe, MD;  Location: [place];  Service: Cardiovascular;  Laterality: N/A;   CESAREAN SECTION     TEE WITHOUT CARDIOVERSION N/A 06/24/2019   Procedure: TRANSESOPHAGEAL ECHOCARDIOGRAM (TEE);  Surgeon: Elouise Munroe, MD;  Location: [facility] ENDOSCOPY;  Service: Cardiovascular;  Laterality: N/A;   TUBAL LIGATION       Allergies  Allergen Reactions  Cinnamon Anaphylaxis   Shellfish Allergy Anaphylaxis   Poison Ivy Extract     Rash    Poison Oak Extract     Rash       Family History  Problem Relation Age of Onset   Cancer Father    Cancer Mother    Urolithiasis Neg Hx    Lupus Neg Hx    Sudden death Neg Hx    Sickle cell trait Neg Hx    Prostate cancer Neg Hx    Clotting disorder Neg Hx      Social History Ms. Bluestone reports that she has never smoked. She has never used smokeless tobacco. Ms. Ohta reports that she does not currently use alcohol.   Review of Systems CONSTITUTIONAL: No weight loss, fever, chills, weakness or fatigue.  HEENT: Eyes: No visual loss, blurred vision, double vision or yellow sclerae.No hearing loss, sneezing, congestion, runny nose or sore throat.  SKIN: No rash or itching.  CARDIOVASCULAR: per hpi RESPIRATORY: per  hpi GASTROINTESTINAL: No anorexia, nausea, vomiting or diarrhea. No abdominal pain or blood.  GENITOURINARY: No burning on urination, no polyuria NEUROLOGICAL: No headache, dizziness, syncope, paralysis, ataxia, numbness or tingling in the extremities. No change in bowel or bladder control.  MUSCULOSKELETAL: No muscle, back pain, joint pain or stiffness.  LYMPHATICS: No enlarged nodes. No history of splenectomy.  PSYCHIATRIC: No history of depression or anxiety.  ENDOCRINOLOGIC: No reports of sweating, cold or heat intolerance. No polyuria or polydipsia.  Marland Kitchen   Physical Examination Today's Vitals   06/09/22 1304  BP: (!) 146/98  Pulse: 72  SpO2: 97%  Weight: 197 lb (89.4 kg)  Height: 5\' 3"  (1.6 m)   Body mass index is 34.9 kg/m.  Gen: resting comfortably, no acute distress HEENT: no scleral icterus, pupils equal round and reactive, no palptable cervical adenopathy,  [id number], no mrg, no jvd Resp: Clear to auscultation bilaterally GI: abdomen is soft, non-tender, non-distended, normal bowel sounds, no hepatosplenomegaly MSK: extremities are warm, no edema.  Skin: warm, no rash Neuro:  no focal deficits Psych: appropriate affect   Diagnostic Studies  TEE 06/24/19 1. Left ventricular ejection fraction, by estimation, is 55 to 60%. The  left ventricle has normal function. The left ventricle has no regional  wall motion abnormalities.   2. Right ventricular systolic function is mildly reduced. The right  ventricular size is moderately enlarged.   3. Left atrial size was mild to moderately dilated. No left atrial/left  atrial appendage thrombus was detected. The LAA emptying velocity was 42 cm/s.   4. Right atrial size was mildly dilated.   5. Small pericardial effusion. The pericardial effusion is  circumferential. There is no evidence of cardiac tamponade.   6. The mitral valve is degenerative. Mild to moderate mitral valve  regurgitation.   7. Tricuspid valve regurgitation  is mild to moderate.   8. The aortic valve is normal in structure. Aortic valve regurgitation is  trivial. No aortic stenosis is present.   9. There is mild (Grade II) atheroma plaque involving the transverse and descending aorta.  10. Evidence of atrial level shunting detected by color flow Doppler.  Agitated saline contrast bubble study was positive with shunting observed within 3-6 cardiac cycles suggestive of interatrial shunt. 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   Conclusion(s)/Recommendation(s): No LA/LAA thrombus identified. Successful  cardioversion performed with restoration of normal sinus rhythm.        Assessment and Plan  1. Afib/aflutter/acquired thrombophilia - no symptoms, continue current meds including eliquis for stroke prvention   2. LE edema - overall controlled, contineu prn lasix   3. Hyperlipidemia -at goal, continue current meds  4. Elevated bp - update [place] on home bp's on Monday, likely would add norvasc if needed      Arnoldo Lenis, M.D.

## 2022-06-13 ENCOUNTER — Telehealth: Payer: Self-pay | Admitting: Cardiology

## 2022-06-13 NOTE — Telephone Encounter (Signed)
Pt c/o BP issue: STAT if pt c/o blurred vision, one-sided weakness or slurred speech  1. What are your last 5 BP readings?  4/05: 121/84 65 4/06: 121/77 76          127/78 87 4/07: 119/82 86          126/82 76   2. Are you having any other symptoms (ex. Dizziness, headache, blurred vision, passed out)?  No   3. What is your BP issue?  Patient's daughter is following up to provide BP readings.

## 2022-06-14 NOTE — Telephone Encounter (Signed)
Normal bp's, no med changes needed  Dominga Ferry MD

## 2022-06-14 NOTE — Telephone Encounter (Signed)
Per DPR daughter made aware, verbalized understanding. Wants to know if patient will need echo before her July f/u with Sharlene Dory? Please advise.

## 2022-06-14 NOTE — Telephone Encounter (Signed)
Daughter made aware 

## 2022-07-28 ENCOUNTER — Ambulatory Visit: Payer: Medicare PPO | Admitting: Cardiology

## 2022-09-15 ENCOUNTER — Encounter: Payer: Self-pay | Admitting: Nurse Practitioner

## 2022-09-15 ENCOUNTER — Ambulatory Visit: Payer: Medicare PPO | Attending: Nurse Practitioner | Admitting: Nurse Practitioner

## 2022-09-15 VITALS — BP 112/60 | HR 57 | Ht 63.0 in | Wt 193.4 lb

## 2022-09-15 DIAGNOSIS — I4891 Unspecified atrial fibrillation: Secondary | ICD-10-CM | POA: Diagnosis not present

## 2022-09-15 DIAGNOSIS — R0609 Other forms of dyspnea: Secondary | ICD-10-CM

## 2022-09-15 DIAGNOSIS — E785 Hyperlipidemia, unspecified: Secondary | ICD-10-CM

## 2022-09-15 DIAGNOSIS — R062 Wheezing: Secondary | ICD-10-CM

## 2022-09-15 DIAGNOSIS — I4892 Unspecified atrial flutter: Secondary | ICD-10-CM | POA: Diagnosis not present

## 2022-09-15 DIAGNOSIS — E669 Obesity, unspecified: Secondary | ICD-10-CM

## 2022-09-15 MED ORDER — APIXABAN 5 MG PO TABS: 5.0000 mg | ORAL_TABLET | Freq: Two times a day (BID) | ORAL | 3 refills | Status: DC

## 2022-09-15 MED ORDER — METOPROLOL TARTRATE 50 MG PO TABS: 50.0000 mg | ORAL_TABLET | Freq: Two times a day (BID) | ORAL | 3 refills | Status: DC

## 2022-09-15 NOTE — Patient Instructions (Addendum)
Medication Instructions:  Your physician recommends that you continue on your current medications as directed. Please refer to the Current Medication list given to you today.  Labwork: none  Testing/Procedures: none  Follow-Up: Your physician recommends that you schedule a follow-up appointment in: 3 Months with Philis Nettle   Any Other Special Instructions Will Be Listed Below (If Applicable).      Mediterranean Diet  Why follow it? Research shows. Those who follow the Mediterranean diet have a reduced risk of heart disease  The diet is associated with a reduced incidence of Parkinson's and Alzheimer's diseases People following the diet may have longer life expectancies and lower rates of chronic diseases  The Dietary Guidelines for Americans recommends the Mediterranean diet as an eating plan to promote health and prevent disease  What Is the Mediterranean Diet?  Healthy eating plan based on typical foods and recipes of Mediterranean-style cooking The diet is primarily a plant based diet; these foods should make up a majority of meals   Starches - Plant based foods should make up a majority of meals - They are an important sources of vitamins, minerals, energy, antioxidants, and fiber - Choose whole grains, foods high in fiber and minimally processed items  - Typical grain sources include wheat, oats, barley, corn, brown rice, bulgar, farro, millet, polenta, couscous  - Various types of beans include chickpeas, lentils, fava beans, black beans, white beans   Fruits  Veggies - Large quantities of antioxidant rich fruits & veggies; 6 or more servings  - Vegetables can be eaten raw or lightly drizzled with oil and cooked  - Vegetables common to the traditional Mediterranean Diet include: artichokes, arugula, beets, broccoli, brussel sprouts, cabbage, carrots, celery, collard greens, cucumbers, eggplant, kale, leeks, lemons, lettuce, mushrooms, okra, onions, peas, peppers, potatoes, pumpkin,  radishes, rutabaga, shallots, spinach, sweet potatoes, turnips, zucchini - Fruits common to the Mediterranean Diet include: apples, apricots, avocados, cherries, clementines, dates, figs, grapefruits, grapes, melons, nectarines, oranges, peaches, pears, pomegranates, strawberries, tangerines  Fats - Replace butter and margarine with healthy oils, such as olive oil, canola oil, and tahini  - Limit nuts to no more than a handful a day  - Nuts include walnuts, almonds, pecans, pistachios, pine nuts  - Limit or avoid candied, honey roasted or heavily salted nuts - Olives are central to the [employer] - can be eaten whole or used in a variety of dishes   Meats Protein - Limiting red meat: no more than a few times a month - When eating red meat: choose lean cuts and keep the portion to the size of deck of cards - Eggs: approx. 0 to 4 times a week  - Fish and lean poultry: at least 2 a week  - Healthy protein sources include, chicken, [place], lean beef, lamb - Increase intake of seafood such as tuna, salmon, trout, mackerel, shrimp, scallops - Avoid or limit high fat processed meats such as sausage and bacon  Dairy - Include moderate amounts of low fat dairy products  - Focus on healthy dairy such as fat free yogurt, skim milk, low or reduced fat cheese - Limit dairy products higher in fat such as whole or 2% milk, cheese, ice cream  Alcohol - Moderate amounts of red wine is ok  - No more than 5 oz daily for women (all ages) and men older than age 49  - No more than 10 oz of wine daily for men younger than 35  Other - Limit sweets and  other desserts  - Use herbs and spices instead of salt to flavor foods  - Herbs and spices common to the traditional Mediterranean Diet include: basil, bay leaves, chives, cloves, cumin, fennel, garlic, lavender, marjoram, mint, oregano, parsley, pepper, rosemary, sage, savory, sumac, tarragon, thyme   It's not just a diet, it's a lifestyle:  The Mediterranean  diet includes lifestyle factors typical of those in the region  Foods, drinks and meals are best eaten with others and savored Daily physical activity is important for overall good health This could be strenuous exercise like running and aerobics This could also be more leisurely activities such as walking, housework, yard-work, or taking the stairs Moderation is the key; a balanced and healthy diet accommodates most foods and drinks Consider portion sizes and frequency of consumption of certain foods   Meal Ideas & Options:  Breakfast:  Whole wheat toast or whole wheat English muffins with peanut butter & hard boiled egg Steel cut oats topped with apples & cinnamon and skim milk  Fresh fruit: banana, strawberries, melon, berries, peaches  Smoothies: strawberries, bananas, greek yogurt, peanut butter Low fat greek yogurt with blueberries and granola  Egg white omelet with spinach and mushrooms Breakfast couscous: whole wheat couscous, apricots, skim milk, cranberries  Sandwiches:  Hummus and grilled vegetables (peppers, zucchini, squash) on whole wheat bread   Grilled chicken on whole wheat pita with lettuce, tomatoes, cucumbers or tzatziki  [place] salad on whole wheat bread: tuna salad made with greek yogurt, olives, red peppers, capers, green onions Garlic rosemary lamb pita: lamb sauted with garlic, rosemary, salt & pepper; add lettuce, cucumber, greek yogurt to pita - flavor with lemon juice and black pepper  Seafood:  Mediterranean grilled salmon, seasoned with garlic, basil, parsley, lemon juice and black pepper Shrimp, lemon, and spinach whole-grain pasta salad made with low fat greek yogurt  Seared scallops with lemon orzo  Seared tuna steaks seasoned salt, pepper, coriander topped with tomato mixture of olives, tomatoes, olive oil, minced garlic, parsley, green onions and cappers  Meats:  Herbed greek chicken salad with kalamata olives, cucumber, feta  Red bell peppers stuffed  with spinach, bulgur, lean ground beef (or lentils) & topped with feta   Kebabs: skewers of chicken, tomatoes, onions, zucchini, squash  [place] burgers: made with red onions, mint, dill, lemon juice, feta cheese topped with roasted red peppers Vegetarian Cucumber salad: cucumbers, artichoke hearts, celery, red onion, feta cheese, tossed in olive oil & lemon juice  Hummus and whole grain pita points with a greek salad (lettuce, tomato, feta, olives, cucumbers, red onion) Lentil soup with celery, carrots made with vegetable broth, garlic, salt and pepper  Tabouli salad: parsley, bulgur, mint, scallions, cucumbers, tomato, radishes, lemon juice, olive oil, salt and pepper.   If you need a refill on your cardiac medications before your next appointment, please call your pharmacy.

## 2022-09-15 NOTE — Progress Notes (Signed)
Cardiology Office Note:  .   Date:  09/15/2022  ID:  Kayton, Ripp 1940/07/23, MRN [id number] PCP: [facility]  [facility] Providers Cardiologist:  Dina Rich, MD    History of Present Illness: .   Michelle Ayala is a 82 y.o. female with a PMH of A-fib/Atypical A-flutter, s/p TEE/DCCV in 2021, hypercholesterolemia, shortness of breath/leg edema, obesity, and vertigo, who presents today for 3 month follow-up.   Last seen by Dr. Dina Rich on June 09, 2022. Was doing well at the time.   Today she presents for 3 month follow-up. She states she is doing well. Denies any chest pain, shortness of breath, palpitations, syncope, presyncope, dizziness, orthopnea, PND, swelling or significant weight changes, acute bleeding, or claudication. Daughter does note wheezing with exertion, pt attributes this to deconditioning.   Studies Reviewed: Marland Kitchen    EKG Interpretation Date/Time:  Thursday September 15 2022 09:57:03 EDT Ventricular Rate:  66 PR Interval:    QRS Duration:  102 QT Interval:  416 QTC Calculation: 436 R Axis:   69  Text Interpretation: Atrial fibrillation Low voltage QRS Incomplete right bundle branch block When compared with ECG of 01-Aug-2020 04:47, PREVIOUS ECG IS PRESENT Confirmed by Sharlene Dory [phone]) on 09/15/2022 10:22:28 AM    TEE 06/2019:  1. Left ventricular ejection fraction, by estimation, is 55 to 60%. The  left ventricle has normal function. The left ventricle has no regional  wall motion abnormalities.   2. Right ventricular systolic function is mildly reduced. The right  ventricular size is moderately enlarged.   3. Left atrial size was mild to moderately dilated. No left atrial/left  atrial appendage thrombus was detected. The LAA emptying velocity was 42  cm/s.   4. Right atrial size was mildly dilated.   5. Small pericardial effusion. The pericardial effusion is  circumferential. There is no evidence of cardiac  tamponade.   6. The mitral valve is degenerative. Mild to moderate mitral valve  regurgitation.   7. Tricuspid valve regurgitation is mild to moderate.   8. The aortic valve is normal in structure. Aortic valve regurgitation is  trivial. No aortic stenosis is present.   9. There is mild (Grade II) atheroma plaque involving the transverse and  descending aorta.  10. Evidence of atrial level shunting detected by color flow Doppler.  Agitated saline contrast bubble study was positive with shunting observed  within 3-6 cardiac cycles suggestive of interatrial shunt. There is a  small patent foramen ovale with  bidirectional shunting across atrial septum.   Conclusion(s)/Recommendation(s): No LA/LAA thrombus identified. Successful  cardioversion performed with restoration of normal sinus rhythm.  Risk Assessment/Calculations:    CHA2DS2-VASc Score = 3   This indicates a 3.2% annual risk of stroke. The patient's score is based upon: CHF History: 0 HTN History: 0 Diabetes History: 0 Stroke History: 0 Vascular Disease History: 0 Age Score: 2 Gender Score: 1  Physical Exam:   VS:  BP 112/60   Pulse (!) 57   Ht 5\' 3"  (1.6 m)   Wt 193 lb 6.4 oz (87.7 kg)   SpO2 97%   BMI 34.26 kg/m    Wt Readings from Last 3 Encounters:  09/15/22 193 lb 6.4 oz (87.7 kg)  06/09/22 197 lb (89.4 kg)  11/01/21 194 lb 12.8 oz (88.4 kg)    GEN: Obese, 82 y.o. female in no acute distress NECK: No JVD; No carotid bruits CARDIAC: S1/S2, RRR, no murmurs, rubs,  gallops RESPIRATORY:  Clear to auscultation without rales, wheezing or rhonchi  ABDOMEN: Soft, non-tender, non-distended EXTREMITIES:  No edema; No deformity   ASSESSMENT AND PLAN: .    A-fib/A-flutter Denies any tachycardia or palpitations. HR well controlled. Continue Lopressor and Eliquis. Denies any bleeding issues and on appropriate dosage. Heart healthy diet and regular cardiovascular exercise encouraged.     2. HLD LDL minimally  elevated 04/2022. Continue Vytorin. Heart healthy diet and regular cardiovascular exercise encouraged. Given Mediterranean diet sheet.   3. DOE, wheezing Etiology most likely d/t deconditioning, which patient attributes to. No wheezing noted on exam. Recommended AHA recommendations including 150 minutes of regular activity but to start with light activity and build up from there. Recommended to follow-up with PCP. If no improvement by next OV, plan to update Echo.   4. Obesity Weight loss via diet and exercise encouraged. Discussed the impact being overweight would have on cardiovascular risk.  Dispo: Follow-up with me or APP in 3 months or sooner if anything changes.   Signed, Sharlene Dory, NP

## 2022-12-16 ENCOUNTER — Encounter: Payer: Self-pay | Admitting: Nurse Practitioner

## 2022-12-16 ENCOUNTER — Ambulatory Visit: Payer: Medicare PPO | Attending: Nurse Practitioner | Admitting: Nurse Practitioner

## 2022-12-16 VITALS — BP 118/70 | HR 80 | Ht 63.0 in | Wt 197.0 lb

## 2022-12-16 DIAGNOSIS — R0609 Other forms of dyspnea: Secondary | ICD-10-CM | POA: Diagnosis not present

## 2022-12-16 DIAGNOSIS — E669 Obesity, unspecified: Secondary | ICD-10-CM

## 2022-12-16 DIAGNOSIS — E785 Hyperlipidemia, unspecified: Secondary | ICD-10-CM | POA: Diagnosis not present

## 2022-12-16 DIAGNOSIS — I4892 Unspecified atrial flutter: Secondary | ICD-10-CM | POA: Diagnosis not present

## 2022-12-16 DIAGNOSIS — I4891 Unspecified atrial fibrillation: Secondary | ICD-10-CM | POA: Diagnosis not present

## 2022-12-16 NOTE — Patient Instructions (Addendum)
Medication Instructions:  Your physician recommends that you continue on your current medications as directed. Please refer to the Current Medication list given to you today.  Labwork: None  Testing/Procedures: Your physician has requested that you have an echocardiogram. Echocardiography is a painless test that uses sound waves to create images of your heart. It provides your doctor with information about the size and shape of your heart and how well your heart's chambers and valves are working. This procedure takes approximately one hour. There are no restrictions for this procedure. Please do NOT wear cologne, perfume, aftershave, or lotions (deodorant is allowed). Please arrive 15 minutes prior to your appointment time.  Follow-Up: Your physician recommends that you schedule a follow-up appointment in: 3-4 months   Any Other Special Instructions Will Be Listed Below (If Applicable).  If you need a refill on your cardiac medications before your next appointment, please call your pharmacy.

## 2022-12-16 NOTE — Progress Notes (Signed)
Cardiology Office Note:  .   Date:  12/16/2022  ID:  Kilani, Joffe [birth year], MRN [id number] PCP: [facility]  [facility] Providers Cardiologist:  Dina Rich, MD    History of Present Illness: .   Michelle Ayala is a 82 y.o. female with a PMH of A-fib/Atypical A-flutter, s/p TEE/DCCV in 2021, hypercholesterolemia, shortness of breath/leg edema, obesity, and vertigo, who presents today for 3 month follow-up.   Last seen by Dr. Dina Rich on June 09, 2022. Was doing well at the time.   Today she presents for follow-up. She states she is doing well. PCP has changed her simvastatin to 40 mg once per week, taking Zetia daily. Denies any chest pain, palpitations, syncope, presyncope, dizziness, orthopnea, PND, swelling or significant weight changes, acute bleeding, or claudication. Patient says she doesn't feel winded during exertion, daughter notes that she has been having DOE, and appears more winded during exertion, has to sit and take breaks. Pt doesn't believe that she is having DOE. Says she holds her breath when she walks, says this is a habit she is working on. Denies any other questions or concerns.   Studies Reviewed: Marland Kitchen    TEE 06/2019:  1. Left ventricular ejection fraction, by estimation, is 55 to 60%. The  left ventricle has normal function. The left ventricle has no regional  wall motion abnormalities.   2. Right ventricular systolic function is mildly reduced. The right  ventricular size is moderately enlarged.   3. Left atrial size was mild to moderately dilated. No left atrial/left  atrial appendage thrombus was detected. The LAA emptying velocity was 42  cm/s.   4. Right atrial size was mildly dilated.   5. Small pericardial effusion. The pericardial effusion is  circumferential. There is no evidence of cardiac tamponade.   6. The mitral valve is degenerative. Mild to moderate mitral valve  regurgitation.   7. Tricuspid  valve regurgitation is mild to moderate.   8. The aortic valve is normal in structure. Aortic valve regurgitation is  trivial. No aortic stenosis is present.   9. There is mild (Grade II) atheroma plaque involving the transverse and  descending aorta.  10. Evidence of atrial level shunting detected by color flow Doppler.  Agitated saline contrast bubble study was positive with shunting observed  within 3-6 cardiac cycles suggestive of interatrial shunt. There is a  small patent foramen ovale with  bidirectional shunting across atrial septum.   Conclusion(s)/Recommendation(s): No LA/LAA thrombus identified. Successful  cardioversion performed with restoration of normal sinus rhythm.  Risk Assessment/Calculations:    CHA2DS2-VASc Score = 3   This indicates a 3.2% annual risk of stroke. The patient's score is based upon: CHF History: 0 HTN History: 0 Diabetes History: 0 Stroke History: 0 Vascular Disease History: 0 Age Score: 2 Gender Score: 1  Physical Exam:   VS:  BP 118/70   Pulse 80   Ht 5\' 3"  (1.6 m)   Wt 197 lb (89.4 kg)   SpO2 97%   BMI 34.90 kg/m    Wt Readings from Last 3 Encounters:  12/16/22 197 lb (89.4 kg)  09/15/22 193 lb 6.4 oz (87.7 kg)  06/09/22 197 lb (89.4 kg)    GEN: Obese, 82 y.o. female in no acute distress NECK: No JVD; No carotid bruits CARDIAC: S1/S2, RRR, no murmurs, rubs, gallops RESPIRATORY:  Clear to auscultation without rales, wheezing or rhonchi  ABDOMEN: Soft, non-tender, non-distended EXTREMITIES:  No  edema; No deformity   ASSESSMENT AND PLAN: .    A-fib/A-flutter Denies any tachycardia or palpitations. HR well controlled. Continue Lopressor and Eliquis. Denies any bleeding issues and on appropriate dosage. Heart healthy diet and regular cardiovascular exercise encouraged.     2. HLD LDL minimally elevated 04/2022. PCP recently adjusted medications, will request labs from PCP. Heart healthy diet and regular cardiovascular exercise  encouraged.   3. DOE Patient has previously attributed this to deconditioning, says today that she holds her breath when she walks, which is a habit. Patient denies DOE, but noticed by daughter. Etiology unclear. Will arrange Echocardiogram for further evaluation.   4. Obesity Weight loss via diet and exercise encouraged. Discussed the impact being overweight would have on cardiovascular risk.  Dispo: Follow-up with me or APP in 3-4 months or sooner if anything changes.   Signed, Sharlene Dory, NP

## 2022-12-29 ENCOUNTER — Ambulatory Visit: Payer: Medicare PPO | Attending: Nurse Practitioner

## 2022-12-29 DIAGNOSIS — R0609 Other forms of dyspnea: Secondary | ICD-10-CM | POA: Diagnosis not present

## 2022-12-29 LAB — ECHOCARDIOGRAM COMPLETE
AR max vel: 1.5 cm2
AV Area VTI: 1.21 cm2
AV Area mean vel: 1.37 cm2
AV Mean grad: 5.5 mm[Hg]
AV Peak grad: 9.2 mm[Hg]
Ao pk vel: 1.52 m/s
Calc EF: 66 %
MV VTI: 1.56 cm2
S' Lateral: 2.8 cm
Single Plane A2C EF: 68 %
Single Plane A4C EF: 62.4 %

## 2023-03-22 ENCOUNTER — Other Ambulatory Visit (HOSPITAL_COMMUNITY)
Admission: RE | Admit: 2023-03-22 | Discharge: 2023-03-22 | Disposition: A | Discharge status: Home / Self Care | Payer: Medicare PPO | Source: Ambulatory Visit | Attending: Family Medicine | Admitting: Family Medicine

## 2023-03-22 DIAGNOSIS — R7303 Prediabetes: Secondary | ICD-10-CM | POA: Diagnosis present

## 2023-03-22 DIAGNOSIS — E559 Vitamin D deficiency, unspecified: Secondary | ICD-10-CM | POA: Diagnosis not present

## 2023-03-22 DIAGNOSIS — I10 Essential (primary) hypertension: Secondary | ICD-10-CM | POA: Insufficient documentation

## 2023-03-22 DIAGNOSIS — E782 Mixed hyperlipidemia: Secondary | ICD-10-CM | POA: Diagnosis not present

## 2023-03-22 LAB — BASIC METABOLIC PANEL
Anion gap: 10 (ref 5–15)
BUN: 12 mg/dL (ref 8–23)
CO2: 24 mmol/L (ref 22–32)
Calcium: 9.3 mg/dL (ref 8.9–10.3)
Chloride: 105 mmol/L (ref 98–111)
Creatinine, Ser: 0.91 mg/dL (ref 0.44–1.00)
GFR, Estimated: >60 mL/min (ref >60)
Glucose, Bld: 112 mg/dL — ABNORMAL HIGH (ref 70–99)
Potassium: 3.9 mmol/L (ref 3.5–5.1)
Sodium: 139 mmol/L (ref 135–145)

## 2023-03-22 LAB — LIPID PANEL
Cholesterol: 245 mg/dL — ABNORMAL HIGH (ref 0–200)
HDL: 48 mg/dL (ref >40)
LDL Cholesterol: 172 mg/dL — ABNORMAL HIGH (ref 0–99)
Total CHOL/HDL Ratio: 5.1 {ratio}
Triglycerides: 127 mg/dL (ref <150)
VLDL: 25 mg/dL (ref 0–40)

## 2023-03-22 LAB — VITAMIN D 25 HYDROXY (VIT D DEFICIENCY, FRACTURES): Vit D, 25-Hydroxy: 27.5 ng/mL — ABNORMAL LOW (ref 30–100)

## 2023-03-22 LAB — HEMOGLOBIN A1C
Hgb A1c MFr Bld: 5.8 % — ABNORMAL HIGH (ref 4.8–5.6)
Mean Plasma Glucose: 119.76 mg/dL

## 2023-03-23 LAB — MICROALBUMIN / CREATININE URINE RATIO
Creatinine, Urine: 165.3 mg/dL
Microalb Creat Ratio: 15 mg/g{creat} (ref 0–29)
Microalb, Ur: 25.5 ug/mL — ABNORMAL HIGH

## 2023-03-30 ENCOUNTER — Ambulatory Visit: Payer: Medicare PPO | Attending: Cardiology | Admitting: Cardiology

## 2023-03-30 ENCOUNTER — Encounter: Payer: Self-pay | Admitting: Cardiology

## 2023-03-30 VITALS — BP 130/62 | HR 68 | Ht 63.0 in | Wt 196.6 lb

## 2023-03-30 DIAGNOSIS — I272 Pulmonary hypertension, unspecified: Secondary | ICD-10-CM

## 2023-03-30 DIAGNOSIS — Z79899 Other long term (current) drug therapy: Secondary | ICD-10-CM | POA: Diagnosis not present

## 2023-03-30 DIAGNOSIS — R600 Localized edema: Secondary | ICD-10-CM | POA: Diagnosis not present

## 2023-03-30 MED ORDER — APIXABAN 5 MG PO TABS: 5.0000 mg | ORAL_TABLET | Freq: Two times a day (BID) | ORAL | 1 refills | Status: DC

## 2023-03-30 MED ORDER — METOPROLOL TARTRATE 50 MG PO TABS: 50.0000 mg | ORAL_TABLET | Freq: Two times a day (BID) | ORAL | 1 refills | Status: DC

## 2023-03-30 MED ORDER — FUROSEMIDE 20 MG PO TABS: 20.0000 mg | ORAL_TABLET | ORAL | 2 refills | Status: DC

## 2023-03-30 NOTE — Patient Instructions (Addendum)
Medication Instructions:   Increase your Lasix to 20mg  on Monday, Wednesday & Friday  Continue all other medications.     Labwork:  BMET, Mg - orders given  Please do in 2 weeks Office will contact with results via phone, letter or mychart.     Testing/Procedures:  none  Follow-Up:  6 weeks  Any Other Special Instructions Will Be Listed Below (If Applicable).  Call next week with update on home BP readings.   If you need a refill on your cardiac medications before your next appointment, please call your pharmacy.

## 2023-03-30 NOTE — Progress Notes (Signed)
Clinical Summary Michelle Ayala is a 83 y.o.female seen today for follow upof the following medical problems.        1. Afib/atypical aflutter - followed in afib clinic   - s/p TEE/DCCV 06/17/19 - low HR's after conversion, metoprolol was stopped - recurrent arrhythmia at 06/28/19 visit. Restarted on lopressor 50mg  bid, discussed amio at afib clinic but patient wanted to wait on starting   - occasional palpitations at night, short in duration - no bleeding on eliquis.      -some recent palpitations, occurs daily. Lasts few minutes. No other assocated symptoms. Overall tolerable.  - compliant with meds. No bleeding on eliquis.      2.LE edema - has lasix 20mg  prn - home weights 187-189 lbs.  - limiting sodium intake - overall edema is controlled   - some swelling at times, resolves with lasix.   - occasonial LE edema. Takes lasix prn, on average takes just a few times a month - some ongoing SOB/DOE. DOE with bedroom to kitchen which is new for her.  12/2022 echo: LVEF 60-65%, no WMAs, indet diastolic, mild RV dysfunction, severe pulm HTN, mod TR  - no significant smoking history - no history of autoimmune disease - no snoring, no witnessed apneic episodes, some daytime somnolence - no clinical history of blood clots.    3. Hyperlipidemia - labs followed by pcp - she is on vytorin - side effects on crestor, lipitor, simvastatin.    07/2020 TC 108 TC 144 HDL 44 LDL 80 04/2022 TC 185 TG 155 HDL 56 LDL 104   - more recent labs with pcp - side     4.SOB - DOE with short distances - sedentary lifestyle, has gained 13 lbs oer the last 3 years   5. HLD - on simvastatin once weekly, leg pains on more frequent dosing.  Past Medical History:  Diagnosis Date   Anxiety    Atrial fibrillation (HCC)    Heart disease    High cholesterol    Vertigo      Allergies  Allergen Reactions   Cinnamon Anaphylaxis   Shellfish Allergy Anaphylaxis   Poison Ivy Extract      Rash    Poison Oak Extract     Rash      Current Outpatient Medications  Medication Sig Dispense Refill   acetaminophen (TYLENOL) 500 MG tablet Take 500 mg by mouth every 6 (six) hours as needed for mild pain.     apixaban (ELIQUIS) 5 MG TABS tablet Take 1 tablet (5 mg total) by mouth 2 (two) times daily. 180 tablet 3   cholecalciferol (VITAMIN D) 25 MCG (1000 UNIT) tablet Take 1,000 Units by mouth daily.     ezetimibe (ZETIA) 10 MG tablet Take 10 mg by mouth daily.     fluticasone (FLONASE) 50 MCG/ACT nasal spray Place 2 sprays into both nostrils daily.     furosemide (LASIX) 20 MG tablet Take 20 mg by mouth as needed.     guaiFENesin (MUCINEX) 600 MG 12 hr tablet Take 600 mg by mouth as needed.     meclizine (ANTIVERT) 25 MG tablet Take 1 tablet (25 mg total) by mouth 3 (three) times daily as needed for dizziness. 20 tablet 0   metoprolol tartrate (LOPRESSOR) 50 MG tablet Take 1 tablet (50 mg total) by mouth 2 (two) times daily. 180 tablet 3   ondansetron (ZOFRAN) 4 MG tablet Take 4 mg by mouth every 8 (eight) hours as  needed for nausea or vomiting.     simvastatin (ZOCOR) 40 MG tablet Take 40 mg by mouth once a week.     tamsulosin (FLOMAX) 0.4 MG CAPS capsule Take 0.4 mg by mouth daily as needed.     No current facility-administered medications for this visit.     Past Surgical History:  Procedure Laterality Date   BREAST SURGERY     cyst removal   BUBBLE STUDY  06/24/2019   Procedure: BUBBLE STUDY;  Surgeon: Parke Poisson, MD;  Location: [facility] ENDOSCOPY;  Service: Cardiovascular;;   CARDIOVERSION N/A 06/24/2019   Procedure: CARDIOVERSION;  Surgeon: Parke Poisson, MD;  Location: [facility] ENDOSCOPY;  Service: Cardiovascular;  Laterality: N/A;   CESAREAN SECTION     TEE WITHOUT CARDIOVERSION N/A 06/24/2019   Procedure: TRANSESOPHAGEAL ECHOCARDIOGRAM (TEE);  Surgeon: Parke Poisson, MD;  Location: [facility] ENDOSCOPY;  Service: Cardiovascular;  Laterality: N/A;   TUBAL LIGATION        Allergies  Allergen Reactions   Cinnamon Anaphylaxis   Shellfish Allergy Anaphylaxis   Poison Ivy Extract     Rash    Poison Oak Extract     Rash       Family History  Problem Relation Age of Onset   Cancer Father    Cancer Mother    Urolithiasis Neg Hx    Lupus Neg Hx    Sudden death Neg Hx    Sickle cell trait Neg Hx    Prostate cancer Neg Hx    Clotting disorder Neg Hx      Social History Michelle Ayala reports that she has never smoked. She has never used smokeless tobacco. Michelle Ayala reports that she does not currently use alcohol.    Physical Examination Vitals:   03/30/23 1530  BP: 130/62  Pulse: 68  SpO2: 98%   Filed Weights   03/30/23 1530  Weight: 196 lb 9.6 oz (89.2 kg)    Gen: resting comfortably, no acute distress HEENT: no scleral icterus, pupils equal round and reactive, no palptable cervical adenopathy,  CV: RRR Resp: Clear to auscultation bilaterally GI: abdomen is soft, non-tender, non-distended, normal bowel sounds, no hepatosplenomegaly MSK: extremities are warm, no edema.  Skin: warm, no rash Neuro:  no focal deficits Psych: appropriate affect   Diagnostic Studies TEE 06/24/19 1. Left ventricular ejection fraction, by estimation, is 55 to 60%. The  left ventricle has normal function. The left ventricle has no regional  wall motion abnormalities.   2. Right ventricular systolic function is mildly reduced. The right  ventricular size is moderately enlarged.   3. Left atrial size was mild to moderately dilated. No left atrial/left  atrial appendage thrombus was detected. The LAA emptying velocity was 42 cm/s.   4. Right atrial size was mildly dilated.   5. Small pericardial effusion. The pericardial effusion is  circumferential. There is no evidence of cardiac tamponade.   6. The mitral valve is degenerative. Mild to moderate mitral valve  regurgitation.   7. Tricuspid valve regurgitation is mild to moderate.   8. The aortic  valve is normal in structure. Aortic valve regurgitation is  trivial. No aortic stenosis is present.   9. There is mild (Grade II) atheroma plaque involving the transverse and descending aorta.  10. Evidence of atrial level shunting detected by color flow Doppler.  Agitated saline contrast bubble study was positive with shunting observed within 3-6 cardiac cycles suggestive of interatrial shunt. There is a small patent foramen ovale with  bidirectional shunting across atrial septum.   Conclusion(s)/Recommendation(s): No LA/LAA thrombus identified. Successful  cardioversion performed with restoration of normal sinus rhythm.    12/2022 echo 1. Left ventricular ejection fraction, by estimation, is 60 to 65%. The  left ventricle has normal function. The left ventricle has no regional  wall motion abnormalities. Left ventricular diastolic parameters are  indeterminate.   2. Right ventricular systolic function is mildly reduced. The right  ventricular size is mildly enlarged. There is severely elevated pulmonary  artery systolic pressure. The estimated right ventricular systolic  pressure is 68.2 mmHg.   3. Left atrial size was mild to moderately dilated.   4. The mitral valve is normal in structure. Mild mitral valve  regurgitation. No evidence of mitral stenosis.   5. The tricuspid valve is abnormal. Tricuspid valve regurgitation is  moderate.   6. The aortic valve is tricuspid. Aortic valve regurgitation is trivial.  No aortic stenosis is present.   7. The inferior vena cava is dilated in size with >50% respiratory  variability, suggesting right atrial pressure of 8 mmHg.    Assessment and Plan  1. Severe pulmonary HTN/SOB - noted by echocardiogram - - no significant smoking history - no history of autoimmune disease - no snoring, no witnessed apneic episodes, some daytime somnolence - no clinical history of blood clots.  - plan for RHC to better evaluate severity and hemodynamic  profile of her pulmonary HTN - increase lasix to 20mg  M,W,F. CHeck bmet/mg in 2 weeks  Informed Consent   Shared Decision Making/Informed Consent The risks [stroke (1 in 1000), death (1 in 1000), kidney failure [usually temporary] (1 in 500), bleeding (1 in 200), allergic reaction [possibly serious] (1 in 200)], benefits (diagnostic support and management of coronary artery disease) and alternatives of a cardiac catheterization were discussed in detail with Michelle Ayala and she is willing to proceed.      Antoine Poche, M.D

## 2023-04-03 ENCOUNTER — Telehealth: Payer: Self-pay | Admitting: Cardiology

## 2023-04-03 NOTE — Telephone Encounter (Signed)
Reports during last visit, having a heart cath was discussed and patient is ready to proceed.   Reports BP has been 123/83 & 124/82. Has not taken BP today.

## 2023-04-03 NOTE — Telephone Encounter (Signed)
Please arrange a right heart catheterization for pulmonary hypertension   Dominga Ferry MD

## 2023-04-03 NOTE — Telephone Encounter (Signed)
Daughter Selena Batten) stated patient has decided to do the procedure as was discussed at last visit and wants a call back to discuss next steps.

## 2023-04-04 ENCOUNTER — Telehealth: Payer: Self-pay | Admitting: Cardiology

## 2023-04-04 NOTE — Telephone Encounter (Signed)
Daughter Selena Batten) wants a call back to discuss next steps on patient's procedure being scheduled.

## 2023-04-04 NOTE — Telephone Encounter (Signed)
Spoke with daughter Selena Batten) - will look towards scheduling the week of 04/10/2023.

## 2023-04-04 NOTE — Telephone Encounter (Signed)
Already addressed in previous phone note.

## 2023-04-06 ENCOUNTER — Telehealth: Payer: Self-pay | Admitting: Cardiology

## 2023-04-06 ENCOUNTER — Encounter: Payer: Self-pay | Admitting: *Deleted

## 2023-04-06 ENCOUNTER — Encounter: Payer: Self-pay | Admitting: Cardiology

## 2023-04-06 NOTE — Telephone Encounter (Signed)
Right heart cath scheduled for Friday, 04/14/2023 at 10:30 am - to arrive at 8:30 am.   Instructions given via mychart.

## 2023-04-06 NOTE — Telephone Encounter (Signed)
Checking percert on the following patient for   Cardiac Catheterization on Friday, February 7 with  Dr. Alverda Skeans at 10:30 am.

## 2023-04-07 ENCOUNTER — Other Ambulatory Visit: Payer: Self-pay | Admitting: *Deleted

## 2023-04-07 ENCOUNTER — Other Ambulatory Visit: Payer: Self-pay | Admitting: Cardiology

## 2023-04-07 DIAGNOSIS — Z01812 Encounter for preprocedural laboratory examination: Secondary | ICD-10-CM

## 2023-04-07 DIAGNOSIS — I272 Pulmonary hypertension, unspecified: Secondary | ICD-10-CM

## 2023-04-07 DIAGNOSIS — R0609 Other forms of dyspnea: Secondary | ICD-10-CM

## 2023-04-07 DIAGNOSIS — I4819 Other persistent atrial fibrillation: Secondary | ICD-10-CM

## 2023-04-07 DIAGNOSIS — I4892 Unspecified atrial flutter: Secondary | ICD-10-CM

## 2023-04-10 ENCOUNTER — Other Ambulatory Visit (HOSPITAL_COMMUNITY)
Admission: RE | Admit: 2023-04-10 | Discharge: 2023-04-10 | Disposition: A | Discharge status: Home / Self Care | Payer: Medicare PPO | Source: Ambulatory Visit | Attending: Cardiology | Admitting: Cardiology

## 2023-04-10 DIAGNOSIS — I4819 Other persistent atrial fibrillation: Secondary | ICD-10-CM | POA: Diagnosis present

## 2023-04-10 DIAGNOSIS — Z79899 Other long term (current) drug therapy: Secondary | ICD-10-CM | POA: Diagnosis present

## 2023-04-10 DIAGNOSIS — R0609 Other forms of dyspnea: Secondary | ICD-10-CM | POA: Insufficient documentation

## 2023-04-10 DIAGNOSIS — R600 Localized edema: Secondary | ICD-10-CM | POA: Diagnosis present

## 2023-04-10 DIAGNOSIS — Z01812 Encounter for preprocedural laboratory examination: Secondary | ICD-10-CM | POA: Insufficient documentation

## 2023-04-10 LAB — CBC
HCT: 41.6 % (ref 36.0–46.0)
Hemoglobin: 13.6 g/dL (ref 12.0–15.0)
MCH: 30.9 pg (ref 26.0–34.0)
MCHC: 32.7 g/dL (ref 30.0–36.0)
MCV: 94.5 fL (ref 80.0–100.0)
Platelets: 207 10*3/uL (ref 150–400)
RBC: 4.4 MIL/uL (ref 3.87–5.11)
RDW: 13.2 % (ref 11.5–15.5)
WBC: 7.5 10*3/uL (ref 4.0–10.5)
nRBC: 0 % (ref 0.0–0.2)

## 2023-04-10 LAB — MAGNESIUM: Magnesium: 2.2 mg/dL (ref 1.7–2.4)

## 2023-04-10 LAB — BASIC METABOLIC PANEL
Anion gap: 10 (ref 5–15)
BUN: 15 mg/dL (ref 8–23)
CO2: 25 mmol/L (ref 22–32)
Calcium: 9.5 mg/dL (ref 8.9–10.3)
Chloride: 103 mmol/L (ref 98–111)
Creatinine, Ser: 1.02 mg/dL — ABNORMAL HIGH (ref 0.44–1.00)
GFR, Estimated: 54 mL/min — ABNORMAL LOW (ref >60)
Glucose, Bld: 104 mg/dL — ABNORMAL HIGH (ref 70–99)
Potassium: 4 mmol/L (ref 3.5–5.1)
Sodium: 138 mmol/L (ref 135–145)

## 2023-04-11 ENCOUNTER — Other Ambulatory Visit: Payer: Self-pay

## 2023-04-13 ENCOUNTER — Telehealth: Payer: Self-pay | Admitting: *Deleted

## 2023-04-13 NOTE — Telephone Encounter (Signed)
 Right Heart Cath scheduled at [facility] for: Friday April 14, 2023 10:30 AM Arrival time [facility] Main Entrance A at: 8:30 AM  Nothing to eat after midnight prior to procedure, clear liquids until 5 AM day of procedure.  Medication instructions: -Hold:  Eliquis -last dose 04/11/23 knows to hold until post procedure  Lasix -AM of procedure -Other usual morning medications can be taken with sips of water.  Plan to go home the same day, you will only stay overnight if medically necessary.  You must have responsible adult to drive you home.  Someone must be with you the first 24 hours after you arrive home.  Reviewed procedure instructions with patient.

## 2023-04-14 ENCOUNTER — Ambulatory Visit (HOSPITAL_COMMUNITY)
Admission: RE | Admit: 2023-04-14 | Discharge: 2023-04-14 | Disposition: A | Discharge status: Home / Self Care | Payer: Medicare PPO | Attending: Internal Medicine | Admitting: Internal Medicine

## 2023-04-14 ENCOUNTER — Encounter (HOSPITAL_COMMUNITY)
Admission: RE | Disposition: A | Discharge status: Home / Self Care | Payer: Medicare PPO | Source: Home / Self Care | Attending: Internal Medicine

## 2023-04-14 ENCOUNTER — Other Ambulatory Visit: Payer: Self-pay

## 2023-04-14 DIAGNOSIS — I4892 Unspecified atrial flutter: Secondary | ICD-10-CM | POA: Insufficient documentation

## 2023-04-14 DIAGNOSIS — E785 Hyperlipidemia, unspecified: Secondary | ICD-10-CM | POA: Insufficient documentation

## 2023-04-14 DIAGNOSIS — R0600 Dyspnea, unspecified: Secondary | ICD-10-CM | POA: Insufficient documentation

## 2023-04-14 DIAGNOSIS — Z79899 Other long term (current) drug therapy: Secondary | ICD-10-CM | POA: Insufficient documentation

## 2023-04-14 DIAGNOSIS — Z7901 Long term (current) use of anticoagulants: Secondary | ICD-10-CM | POA: Insufficient documentation

## 2023-04-14 DIAGNOSIS — I272 Pulmonary hypertension, unspecified: Secondary | ICD-10-CM | POA: Diagnosis not present

## 2023-04-14 DIAGNOSIS — R600 Localized edema: Secondary | ICD-10-CM | POA: Insufficient documentation

## 2023-04-14 DIAGNOSIS — I4891 Unspecified atrial fibrillation: Secondary | ICD-10-CM | POA: Insufficient documentation

## 2023-04-14 HISTORY — PX: RIGHT HEART CATH: CATH118263

## 2023-04-14 LAB — POCT I-STAT EG7
Acid-Base Excess: 0 mmol/L (ref 0.0–2.0)
Acid-Base Excess: 0 mmol/L (ref 0.0–2.0)
Bicarbonate: 26.1 mmol/L (ref 20.0–28.0)
Bicarbonate: 26.3 mmol/L (ref 20.0–28.0)
Calcium, Ion: 1.23 mmol/L (ref 1.15–1.40)
Calcium, Ion: 1.24 mmol/L (ref 1.15–1.40)
HCT: 39 % (ref 36.0–46.0)
HCT: 40 % (ref 36.0–46.0)
Hemoglobin: 13.3 g/dL (ref 12.0–15.0)
Hemoglobin: 13.6 g/dL (ref 12.0–15.0)
O2 Saturation: 56 %
O2 Saturation: 56 %
Potassium: 4 mmol/L (ref 3.5–5.1)
Potassium: 4 mmol/L (ref 3.5–5.1)
Sodium: 143 mmol/L (ref 135–145)
Sodium: 143 mmol/L (ref 135–145)
TCO2: 27 mmol/L (ref 22–32)
TCO2: 28 mmol/L (ref 22–32)
pCO2, Ven: 46.4 mm[Hg] (ref 44–60)
pCO2, Ven: 46.7 mm[Hg] (ref 44–60)
pH, Ven: 7.355 (ref 7.25–7.43)
pH, Ven: 7.362 (ref 7.25–7.43)
pO2, Ven: 31 mm[Hg] — CL (ref 32–45)
pO2, Ven: 31 mm[Hg] — CL (ref 32–45)

## 2023-04-14 SURGERY — RIGHT HEART CATH

## 2023-04-14 MED ORDER — FENTANYL CITRATE (PF) 100 MCG/2ML IJ SOLN
INTRAMUSCULAR | Status: AC
  Filled 2023-04-14: qty 2

## 2023-04-14 MED ORDER — ACETAMINOPHEN 325 MG PO TABS: 650.0000 mg | ORAL_TABLET | ORAL | Status: DC | PRN

## 2023-04-14 MED ORDER — ONDANSETRON HCL 4 MG/2ML IJ SOLN: 4.0000 mg | Freq: Four times a day (QID) | INTRAMUSCULAR | Status: DC | PRN

## 2023-04-14 MED ORDER — LIDOCAINE HCL (PF) 1 % IJ SOLN
INTRAMUSCULAR | Status: DC | PRN
  Administered 2023-04-14: 5 mL via INTRADERMAL

## 2023-04-14 MED ORDER — MIDAZOLAM HCL 2 MG/2ML IJ SOLN
INTRAMUSCULAR | Status: DC | PRN
  Administered 2023-04-14: 1 mg via INTRAVENOUS

## 2023-04-14 MED ORDER — HYDRALAZINE HCL 20 MG/ML IJ SOLN: 10.0000 mg | INTRAMUSCULAR | Status: DC | PRN

## 2023-04-14 MED ORDER — HEPARIN (PORCINE) IN NACL 1000-0.9 UT/500ML-% IV SOLN
INTRAVENOUS | Status: DC | PRN
  Administered 2023-04-14: 500 mL

## 2023-04-14 MED ORDER — SODIUM CHLORIDE 0.9 % WEIGHT BASED INFUSION: 1.0000 mL/kg/h | INTRAVENOUS | Status: DC

## 2023-04-14 MED ORDER — SODIUM CHLORIDE 0.9 % IV SOLN: 250.0000 mL | INTRAVENOUS | Status: DC | PRN

## 2023-04-14 MED ORDER — LABETALOL HCL 5 MG/ML IV SOLN: 10.0000 mg | INTRAVENOUS | Status: DC | PRN

## 2023-04-14 MED ORDER — SODIUM CHLORIDE 0.9% FLUSH: 3.0000 mL | INTRAVENOUS | Status: DC | PRN

## 2023-04-14 MED ORDER — MIDAZOLAM HCL 2 MG/2ML IJ SOLN
INTRAMUSCULAR | Status: AC
  Filled 2023-04-14: qty 2

## 2023-04-14 MED ORDER — SODIUM CHLORIDE 0.9 % WEIGHT BASED INFUSION: 3.0000 mL/kg/h | INTRAVENOUS | Status: DC

## 2023-04-14 MED ORDER — LIDOCAINE HCL (PF) 1 % IJ SOLN
INTRAMUSCULAR | Status: AC
  Filled 2023-04-14: qty 30

## 2023-04-14 MED ORDER — SODIUM CHLORIDE 0.9% FLUSH: 3.0000 mL | Freq: Two times a day (BID) | INTRAVENOUS | Status: DC

## 2023-04-14 MED ORDER — FENTANYL CITRATE (PF) 100 MCG/2ML IJ SOLN
INTRAMUSCULAR | Status: DC | PRN
  Administered 2023-04-14: 25 ug via INTRAVENOUS

## 2023-04-14 SURGICAL SUPPLY — 6 items
CATH BALLN WEDGE 5F 110CM (CATHETERS) IMPLANT
GUIDEWIRE .025 260CM (WIRE) IMPLANT
PACK CARDIAC CATHETERIZATION (CUSTOM PROCEDURE TRAY) IMPLANT
SHEATH GLIDE SLENDER 4/5FR (SHEATH) IMPLANT
TRANSDUCER W/STOPCOCK (MISCELLANEOUS) IMPLANT
TUBING ART PRESS 72 MALE/FEM (TUBING) IMPLANT

## 2023-04-14 NOTE — Interval H&P Note (Signed)
 History and Physical Interval Note:  04/14/2023 8:27 AM  Michelle Ayala  has presented today for surgery, with the diagnosis of hp.  The various methods of treatment have been discussed with the patient and family. After consideration of risks, benefits and other options for treatment, the patient has consented to  Procedure(s): RIGHT HEART CATH (N/A) as a surgical intervention.  The patient's history has been reviewed, patient examined, no change in status, stable for surgery.  I have reviewed the patient's chart and labs.  Questions were answered to the patient's satisfaction.     Fritz Cauthon K Haely Leyland

## 2023-04-14 NOTE — Discharge Instructions (Addendum)
 Restart Eliquis  on 04/15/23  Brachial Site Care   This sheet gives you information about how to care for yourself after your procedure. Your health care provider may also give you more specific instructions. If you have problems or questions, contact your health care provider. What can I expect after the procedure? After the procedure, it is common to have: Bruising and tenderness at the catheter insertion area. Follow these instructions at home:  Insertion site care Follow instructions from your health care provider about how to take care of your insertion site. Make sure you: Wash your hands with soap and water before you change your bandage (dressing). If soap and water are not available, use hand sanitizer. Remove your dressing as told by your health care provider. In 24 hours Check your insertion site every day for signs of infection. Check for: Redness, swelling, or pain. Pus or a bad smell. Warmth. You may shower 24-48 hours after the procedure. Do not apply powder or lotion to the site.  Activity For 24 hours after the procedure, or as directed by your health care provider: Do not push or pull heavy objects with the affected arm. Do not drive yourself home from the hospital or clinic. You may drive 24 hours after the procedure unless your health care provider tells you not to. Do not lift anything that is heavier than 10 lb (4.5 kg), or the limit that you are told, until your health care provider says that it is safe.  For 24 hours

## 2023-04-14 NOTE — Interval H&P Note (Signed)
 History and Physical Interval Note:  04/14/2023 10:39 AM  Michelle Ayala  has presented today for surgery, with the diagnosis of hp.  The various methods of treatment have been discussed with the patient and family. After consideration of risks, benefits and other options for treatment, the patient has consented to  Procedure(s): RIGHT HEART CATH (N/A) as a surgical intervention.  The patient's history has been reviewed, patient examined, no change in status, stable for surgery.  I have reviewed the patient's chart and labs.  Questions were answered to the patient's satisfaction.     Michelle Ayala K Maciah Schweigert

## 2023-04-17 ENCOUNTER — Encounter (HOSPITAL_COMMUNITY): Payer: Self-pay | Admitting: Internal Medicine

## 2023-04-21 ENCOUNTER — Encounter: Payer: Self-pay | Admitting: *Deleted

## 2023-04-21 NOTE — Progress Notes (Signed)
Notified patient via Clinical cytogeneticist

## 2023-05-11 ENCOUNTER — Ambulatory Visit: Payer: Medicare PPO | Attending: Cardiology | Admitting: Cardiology

## 2023-05-11 ENCOUNTER — Encounter: Payer: Self-pay | Admitting: Cardiology

## 2023-05-11 VITALS — BP 138/88 | HR 82 | Ht 63.0 in | Wt 193.4 lb

## 2023-05-11 DIAGNOSIS — I272 Pulmonary hypertension, unspecified: Secondary | ICD-10-CM | POA: Diagnosis not present

## 2023-05-11 DIAGNOSIS — I4892 Unspecified atrial flutter: Secondary | ICD-10-CM | POA: Diagnosis not present

## 2023-05-11 NOTE — Patient Instructions (Addendum)
 Medication Instructions:  Your physician recommends that you continue on your current medications as directed. Please refer to the Current Medication list given to you today.  Labwork: none  Testing/Procedures: none  Follow-Up: Your physician recommends that you schedule a follow-up appointment in: 4 months  Any Other Special Instructions Will Be Listed Below (If Applicable). You have been referred to Heart Failure Clinic  If you need a refill on your cardiac medications before your next appointment, please call your pharmacy.

## 2023-05-11 NOTE — Progress Notes (Signed)
 Clinical Summary Michelle Ayala is a 83 y.o.female seen today for follow upof the following medical problems.      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      2. Afib/atypical aflutter - followed in afib clinic   - s/p TEE/DCCV 06/17/19 - low HR's after conversion, metoprolol  was stopped - recurrent arrhythmia at 06/28/19 visit. Restarted on lopressor  50mg  bid, discussed amio at afib clinic but patient wanted to wait on starting   - occasional palpitations at night, short in duration - no bleeding on eliquis .      -some recent palpitations, occurs daily. Lasts few minutes. No other assocated symptoms. Overall tolerable.  - compliant with meds. No bleeding on eliquis .        3.LE edema - has lasix  20mg  prn - home weights 187-189 lbs.  - limiting sodium intake - overall edema is controlled   - some swelling at times, resolves with lasix .   - occasonial LE edema. Takes lasix  prn, on average takes just a few times a month - some ongoing SOB/DOE. DOE with bedroom to kitchen which is new for her.  12/2022 echo: LVEF 60-65%, no WMAs, indet diastolic, mild RV dysfunction, severe pulm HTN, mod TR   - no significant smoking history - no history of autoimmune disease - no snoring, no witnessed apneic episodes, some daytime somnolence - no clinical history of blood clots.       4. Hyperlipidemia - labs followed by pcp - she is on vytorin  - side effects on crestor, lipitor, simvastatin .    07/2020 TC 108 TC 144 HDL 44 LDL 80 04/2022 TC 185 TG 155 HDL 56 LDL 104        5.SOB - DOE with short distances -  sedentary lifestyle, has gained 13 lbs oer the last 3 years      Past Medical History:  Diagnosis Date   Anxiety    Atrial fibrillation (HCC)    Heart disease    High cholesterol    Vertigo      Allergies  Allergen Reactions   Cinnamon Anaphylaxis   Shellfish Allergy Anaphylaxis   Poison Ivy Extract     Rash    Poison Oak Extract     Rash      Current Outpatient Medications  Medication Sig Dispense Refill   acetaminophen  (TYLENOL ) 500 MG tablet Take 500 mg by mouth every 6 (six) hours as needed for mild pain.     albuterol (VENTOLIN HFA) 108 (90 Base) MCG/ACT inhaler Inhale 2 puffs into the lungs every 6 (six) hours as needed for wheezing or shortness of breath.     apixaban  (ELIQUIS ) 5 MG TABS tablet Take 1 tablet (5 mg total) by mouth 2 (two) times daily. 180 tablet 1   cholecalciferol  (VITAMIN D ) 25 MCG (1000 UNIT) tablet Take 1,000 Units by mouth daily.     ezetimibe  (ZETIA ) 10 MG tablet Take 10 mg by mouth daily.     fluticasone (FLONASE) 50 MCG/ACT nasal spray Place 2 sprays into both nostrils daily as needed for allergies.     furosemide  (LASIX ) 20  MG tablet Take 1 tablet (20 mg total) by mouth as directed. On Monday, Wednesday & Friday. 30 tablet 2   guaiFENesin (MUCINEX) 600 MG 12 hr tablet Take 600 mg by mouth daily as needed for cough.     meclizine  (ANTIVERT ) 25 MG tablet Take 1 tablet (25 mg total) by mouth 3 (three) times daily as needed for dizziness. 20 tablet 0   metoprolol  tartrate (LOPRESSOR ) 50 MG tablet Take 1 tablet (50 mg total) by mouth 2 (two) times daily. 180 tablet 1   ondansetron  (ZOFRAN ) 4 MG tablet Take 4 mg by mouth every 8 (eight) hours as needed for nausea or vomiting.     simvastatin  (ZOCOR ) 40 MG tablet Take 40 mg by mouth once a week.     No current facility-administered medications for this visit.     Past Surgical History:  Procedure Laterality Date   BREAST SURGERY     cyst removal   BUBBLE STUDY  06/24/2019   Procedure: BUBBLE  STUDY;  Surgeon: Euell Herrlich, MD;  Location: [facility] ENDOSCOPY;  Service: Cardiovascular;;   CARDIOVERSION N/A 06/24/2019   Procedure: CARDIOVERSION;  Surgeon: Euell Herrlich, MD;  Location: [facility] ENDOSCOPY;  Service: Cardiovascular;  Laterality: N/A;   CESAREAN SECTION     RIGHT HEART CATH N/A 04/14/2023   Procedure: RIGHT HEART CATH;  Surgeon: Kyra Phy, MD;  Location: [facility] INVASIVE CV LAB;  Service: Cardiovascular;  Laterality: N/A;   TEE WITHOUT CARDIOVERSION N/A 06/24/2019   Procedure: TRANSESOPHAGEAL ECHOCARDIOGRAM (TEE);  Surgeon: Euell Herrlich, MD;  Location: [facility] ENDOSCOPY;  Service: Cardiovascular;  Laterality: N/A;   TUBAL LIGATION       Allergies  Allergen Reactions   Cinnamon Anaphylaxis   Shellfish Allergy Anaphylaxis   Poison Ivy Extract     Rash    Poison Oak Extract     Rash       Family History  Problem Relation Age of Onset   Cancer Father    Cancer Mother    Urolithiasis Neg Hx    Lupus Neg Hx    Sudden death Neg Hx    Sickle cell trait Neg Hx    Prostate cancer Neg Hx    Clotting disorder Neg Hx      Social History Michelle Ayala reports that she has never smoked. She has never used smokeless tobacco. Michelle Ayala reports that she does not currently use alcohol.   Physical Examination Today's Vitals   05/11/23 1202  BP: 138/88  Pulse: 82  SpO2: 95%  Weight: 193 lb 6.4 oz (87.7 kg)  Height: 5' 3 (1.6 m)   Body mass index is 34.26 kg/m.  Gen: resting comfortably, no acute distress HEENT: no scleral icterus, pupils equal round and reactive, no palptable cervical adenopathy,  CV: RRR, no m/r,g no jvd Resp: Clear to auscultation bilaterally GI: abdomen is soft, non-tender, non-distended, normal bowel sounds, no hepatosplenomegaly MSK: extremities are warm, no edema.  Skin: warm, no rash Neuro:  no focal deficits Psych: appropriate affect   Diagnostic Studies TEE 06/24/19 1. Left ventricular ejection fraction, by estimation, is 55 to  60%. The  left ventricle has normal function. The left ventricle has no regional  wall motion abnormalities.   2. Right ventricular systolic function is mildly reduced. The right  ventricular size is moderately enlarged.   3. Left atrial size was mild to moderately dilated. No left atrial/left  atrial appendage thrombus was detected. The LAA emptying velocity was 42 cm/s.  4. Right atrial size was mildly dilated.   5. Small pericardial effusion. The pericardial effusion is  circumferential. There is no evidence of cardiac tamponade.   6. The mitral valve is degenerative. Mild to moderate mitral valve  regurgitation.   7. Tricuspid valve regurgitation is mild to moderate.   8. The aortic valve is normal in structure. Aortic valve regurgitation is  trivial. No aortic stenosis is present.   9. There is mild (Grade II) atheroma plaque involving the transverse and descending aorta.  10. Evidence of atrial level shunting detected by color flow Doppler.  Agitated saline contrast bubble study was positive with shunting observed within 3-6 cardiac cycles suggestive of interatrial shunt. 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   Conclusion(s)/Recommendation(s): No LA/LAA thrombus identified. Successful  cardioversion performed with restoration of normal sinus rhythm.    12/2022 echo 1. Left ventricular ejection fraction, by estimation, is 60 to 65%. The  left ventricle has normal function. The left ventricle has no regional  wall motion abnormalities. Left ventricular diastolic parameters are  indeterminate.   2. Right ventricular systolic function is mildly reduced. The right  ventricular size is mildly enlarged. There is severely elevated pulmonary  artery systolic pressure. The estimated right ventricular systolic  pressure is 68.2 mmHg.   3. Left atrial size was mild to moderately dilated.   4. The mitral valve is normal in structure. Mild  mitral valve  regurgitation. No evidence of mitral stenosis.   5. The tricuspid valve is abnormal. Tricuspid valve regurgitation is  moderate.   6. The aortic valve is tricuspid. Aortic valve regurgitation is trivial.  No aortic stenosis is present.   7. The inferior vena cava is dilated in size with >50% respiratory  variability, suggesting right atrial pressure of 8 mmHg.     Assessment and Plan   1. Pulmonary HTN/SOB -we will refer to HF clinic for further evaluation. Prior workup as listed above  2. Afib/aflutter - continue current meds     Laurann Pollock, M.D.

## 2023-06-19 ENCOUNTER — Other Ambulatory Visit (HOSPITAL_COMMUNITY)
Admission: RE | Admit: 2023-06-19 | Discharge: 2023-06-19 | Disposition: A | Discharge status: Home / Self Care | Source: Ambulatory Visit | Attending: Family Medicine | Admitting: Family Medicine

## 2023-06-19 DIAGNOSIS — E782 Mixed hyperlipidemia: Secondary | ICD-10-CM | POA: Insufficient documentation

## 2023-06-19 LAB — LIPID PANEL
Cholesterol: 270 mg/dL — ABNORMAL HIGH (ref 0–200)
HDL: 42 mg/dL (ref >40)
LDL Cholesterol: 197 mg/dL — ABNORMAL HIGH (ref 0–99)
Total CHOL/HDL Ratio: 6.4 ratio
Triglycerides: 155 mg/dL — ABNORMAL HIGH (ref <150)
VLDL: 31 mg/dL (ref 0–40)

## 2023-07-04 ENCOUNTER — Ambulatory Visit (HOSPITAL_COMMUNITY)
Admission: RE | Admit: 2023-07-04 | Discharge: 2023-07-04 | Disposition: A | Discharge status: Home / Self Care | Source: Ambulatory Visit | Attending: Internal Medicine | Admitting: Internal Medicine

## 2023-07-04 ENCOUNTER — Telehealth (HOSPITAL_COMMUNITY): Payer: Self-pay

## 2023-07-04 ENCOUNTER — Other Ambulatory Visit (HOSPITAL_COMMUNITY): Payer: Self-pay

## 2023-07-04 VITALS — BP 154/80 | HR 74 | Ht 63.0 in | Wt 192.2 lb

## 2023-07-04 DIAGNOSIS — I110 Hypertensive heart disease with heart failure: Secondary | ICD-10-CM | POA: Diagnosis not present

## 2023-07-04 DIAGNOSIS — I4821 Permanent atrial fibrillation: Secondary | ICD-10-CM | POA: Diagnosis not present

## 2023-07-04 DIAGNOSIS — Z7901 Long term (current) use of anticoagulants: Secondary | ICD-10-CM | POA: Insufficient documentation

## 2023-07-04 DIAGNOSIS — G4719 Other hypersomnia: Secondary | ICD-10-CM | POA: Diagnosis not present

## 2023-07-04 DIAGNOSIS — Z6834 Body mass index (BMI) 34.0-34.9, adult: Secondary | ICD-10-CM | POA: Insufficient documentation

## 2023-07-04 DIAGNOSIS — I5032 Chronic diastolic (congestive) heart failure: Secondary | ICD-10-CM | POA: Diagnosis not present

## 2023-07-04 DIAGNOSIS — E669 Obesity, unspecified: Secondary | ICD-10-CM | POA: Diagnosis not present

## 2023-07-04 DIAGNOSIS — I4819 Other persistent atrial fibrillation: Secondary | ICD-10-CM | POA: Diagnosis present

## 2023-07-04 DIAGNOSIS — I272 Pulmonary hypertension, unspecified: Secondary | ICD-10-CM | POA: Insufficient documentation

## 2023-07-04 DIAGNOSIS — I4892 Unspecified atrial flutter: Secondary | ICD-10-CM | POA: Insufficient documentation

## 2023-07-04 LAB — BASIC METABOLIC PANEL WITH GFR
Anion gap: 8 (ref 5–15)
BUN: 10 mg/dL (ref 8–23)
CO2: 26 mmol/L (ref 22–32)
Calcium: 9.2 mg/dL (ref 8.9–10.3)
Chloride: 105 mmol/L (ref 98–111)
Creatinine, Ser: 0.92 mg/dL (ref 0.44–1.00)
GFR, Estimated: >60 mL/min (ref >60)
Glucose, Bld: 110 mg/dL — ABNORMAL HIGH (ref 70–99)
Potassium: 4.3 mmol/L (ref 3.5–5.1)
Sodium: 139 mmol/L (ref 135–145)

## 2023-07-04 LAB — BRAIN NATRIURETIC PEPTIDE: B Natriuretic Peptide: 426.6 pg/mL — ABNORMAL HIGH (ref 0.0–100.0)

## 2023-07-04 MED ORDER — FUROSEMIDE 20 MG PO TABS: 20.0000 mg | ORAL_TABLET | ORAL | Status: DC

## 2023-07-04 MED ORDER — EMPAGLIFLOZIN 10 MG PO TABS: 10.0000 mg | ORAL_TABLET | Freq: Every day | ORAL | 6 refills | Status: DC

## 2023-07-04 MED ORDER — EMPAGLIFLOZIN 10 MG PO TABS: 10.0000 mg | ORAL_TABLET | Freq: Every day | ORAL | 6 refills | Status: AC

## 2023-07-04 NOTE — Telephone Encounter (Signed)
 Advanced Heart Failure Patient Advocate Encounter  Test billing for Jardiance returns $40 copay for 30 day supply, $80 copay for 90 day supply.  Kennis Peacock, CPhT Rx Patient Advocate Phone: [phone]

## 2023-07-04 NOTE — Progress Notes (Signed)
 ADVANCED HF CLINIC CONSULT NOTE  Referring Physician: [facility] Primary Care: [facility] Primary Cardiologist: Armida Lander, MD  Chief Complaint: PAH  HPI:  Michelle Ayala is a 83 y.o. female with chronic AF/AFL, HTN, obesity and PAH referred by Dr. Amanda Jungling for [facility]  Had TEE/DCCV in 4/12 for AF. Remained out for about 10 days and recurred. Seen in AF clinic. Discussed amiodarone but family decided against it.    Echo 10/24 EF 60-65 RV mildly reduced RVSP 68. Mild MR. Severe LAE.   RHC 2/25 RA 6 PA45/21 (31) PCPW 17 (v=22)  PVR 4.2 Fick 3.5/1.8   Here with her daughter.. Says she gets SOB with walking and gets fatigued easily but can do ADLs. No CP. Says it started back in 2021 when she developed AF. Mild edema with excess salt. Improves overnight. Denies snoring.  SBP 127-132 at home.   Past Medical History:  Diagnosis Date   Anxiety    Atrial fibrillation (HCC)    Heart disease    High cholesterol    Vertigo     Current Outpatient Medications  Medication Sig Dispense Refill   acetaminophen  (TYLENOL ) 500 MG tablet Take 500 mg by mouth every 6 (six) hours as needed for mild pain.     albuterol (VENTOLIN HFA) 108 (90 Base) MCG/ACT inhaler Inhale 2 puffs into the lungs every 6 (six) hours as needed for wheezing or shortness of breath.     apixaban  (ELIQUIS ) 5 MG TABS tablet Take 1 tablet (5 mg total) by mouth 2 (two) times daily. 180 tablet 1   cholecalciferol  (VITAMIN D ) 25 MCG (1000 UNIT) tablet Take 1,000 Units by mouth daily.     ezetimibe  (ZETIA ) 10 MG tablet Take 10 mg by mouth daily.     fluticasone (FLONASE) 50 MCG/ACT nasal spray Place 2 sprays into both nostrils daily as needed for allergies.     furosemide  (LASIX ) 20 MG tablet Take 1 tablet (20 mg total) by mouth as directed. On Monday, Wednesday & Friday. 30 tablet 2   guaiFENesin (MUCINEX) 600 MG 12 hr tablet Take 600 mg by mouth daily as needed for cough.      meclizine  (ANTIVERT ) 25 MG tablet Take 1 tablet (25 mg total) by mouth 3 (three) times daily as needed for dizziness. 20 tablet 0   metoprolol  tartrate (LOPRESSOR ) 50 MG tablet Take 1 tablet (50 mg total) by mouth 2 (two) times daily. 180 tablet 1   ondansetron  (ZOFRAN ) 4 MG tablet Take 4 mg by mouth every 8 (eight) hours as needed for nausea or vomiting.     No current facility-administered medications for this encounter.    Allergies  Allergen Reactions   Cinnamon Anaphylaxis   Shellfish Allergy Anaphylaxis   Poison Ivy Extract     Rash    Poison Oak Extract     Rash       Social History   Socioeconomic History   Marital status: Divorced    Spouse name: Not on file   Number of children: Not on file   Years of education: Not on file   Highest education level: Not on file  Occupational History   Not on file  Tobacco Use   Smoking status: Never   Smokeless tobacco: Never  Substance and Sexual Activity   Alcohol use: Not Currently    Comment: occ   Drug use: No   Sexual activity: Not on file  Other Topics  Concern   Not on file  Social History Narrative   Not on file   Social Drivers of Health   Financial Resource Strain: Not on file  Food Insecurity: Not on file  Transportation Needs: Not on file  Physical Activity: Not on file  Stress: Not on file  Social Connections: Not on file  Intimate Partner Violence: Not on file      Family History  Problem Relation Age of Onset   Hypertension Mother    Heart attack Mother 15   Heart attack Father 2   Heart attack Brother 36   Hyperlipidemia Brother    Cancer Brother        throat and tongue cancer, base of skull   Other Other        per, Eppie Hasting, daughter and patient-mother or father did not have a history of cancer   Breast cancer Daughter    Urolithiasis Neg Hx    Lupus Neg Hx    Sudden death Neg Hx    Sickle cell trait Neg Hx    Prostate cancer Neg Hx    Clotting disorder Neg Hx     Vitals:    07/04/23 1042  BP: (!) 154/80  Pulse: 74  SpO2: 96%  Weight: 87.2 kg (192 lb 3.2 oz)  Height: 5\' 3"  (1.6 m)   Body mass index is 34.05 kg/m.   PHYSICAL EXAM: General:  Elderly Well appearing. No respiratory difficulty HEENT: normal Neck: supple. no JVD. Carotids 2+ bilat; no bruits. No lymphadenopathy or thryomegaly appreciated. Cor: irregular rate & rhythm. No rubs, gallops or murmurs. Lungs: clear Abdomen: obese soft, nontender, nondistended. No hepatosplenomegaly. No bruits or masses. Good bowel sounds. Extremities: no cyanosis, clubbing, rash, edema Neuro: alert & oriented x 3, cranial nerves grossly intact. moves all 4 extremities w/o difficulty. Affect pleasant.  ECG: AF 72 No ST-T wave abnormalities. Personally reviewed   ASSESSMENT & PLAN:  1. 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 Mild MR. Severe LAE.  - RHC 2/25 RA 6 PA 45/21 (31) PCPW 17 (v=22)  PVR 4.2 Fick 3.5/1.8  - Mainstay of therapy will be to manage volume status and BP closely. Weight loss will also help. No role for selected pulmonary artery vasodilators - Start jardiance  10 - Decrease lasix  to M/F - Will get sleep study - Check labs  2. Chronic diastolic HF - plan as above  3. Permanent AF - suspect AF is major contributor to her symptoms in setting of advanced diastolic dysfunction but has previously failed DC-CV and given duration of AF would be difficult if not impossible to maintain NSR even with amio.That said, if symptoms persist may be worth a try  4. Obesity  - consider [id number]   Jules Oar, MD  11:06 AM

## 2023-07-04 NOTE — Patient Instructions (Signed)
 Great to see you today!!!  Medication Changes:  DECREASE Furosemide  to 20 mg every Monday and Friday  START Jardiance 10 mg Daily  Your provider has prescribed Jardiance for you. Please be aware the most common side effect of this medication is urinary tract infections and yeast infections. Please practice good hygiene and keep this area clean and dry to help prevent this. If you do begin to have symptoms of these infections, such as difficulty urinating or painful urination,  please let us  know.   Lab Work:  Labs done today, your results will be available in MyChart, we will contact you for abnormal readings.   Testing/Procedures:  Your provider has recommended that you have a home sleep study ([facility] Test).  We have provided you with the equipment in our office today. Please go ahead and download the app. DO NOT OPEN OR TAMPER WITH THE BOX UNTIL WE ADVISE YOU TO DO SO. Once insurance has approved the test our office will call you with PIN number and approval to proceed with testing. Once you have completed the test you just dispose of the equipment, the information is automatically uploaded to us  via blue-tooth technology. If your test is positive for sleep apnea and you need a home CPAP machine you will be contacted by Dr Charl Concha office [facility]) to set this up.  Special Instructions // Education:  Do the following things EVERYDAY: Weigh yourself in the morning before breakfast. Write it down and keep it in a log. Take your medicines as prescribed Eat low salt foods--Limit salt (sodium) to 2000 mg per day.  Stay as active as you can everyday Limit all fluids for the day to less than 2 liters   Follow-Up in: 1-2 months   At the [facility], you and your health needs are our priority. We have a designated team specialized in the treatment of Heart Failure. This Care Team includes your primary Heart Failure Specialized Cardiologist (physician), Advanced  Practice Providers (APPs- Physician Assistants and Nurse Practitioners), and Pharmacist who all work together to provide you with the care you need, when you need it.   You may see any of the following providers on your designated Care Team at your next follow up:  Dr. Jules Oar Dr. Peder Bourdon Dr. Alwin Baars Dr. Judyth Nunnery Nieves Bars, NP Ruddy Corral, [place] [facility] [place], [place] Dennise Fitz, NP [place] Lee, NP Luster Salters, PharmD   Please be sure to bring in all your medications bottles to every appointment.   Need to Contact Us :  If you have any questions or concerns before your next appointment please send us  a message through [place] or call our office at [phone].    TO LEAVE A MESSAGE FOR THE NURSE SELECT OPTION 2, PLEASE LEAVE A MESSAGE INCLUDING: YOUR NAME DATE OF BIRTH CALL BACK NUMBER REASON FOR CALL**this is important as we prioritize the call backs  YOU WILL RECEIVE A CALL BACK THE SAME DAY AS LONG AS YOU CALL BEFORE 4:00 PM

## 2023-07-05 ENCOUNTER — Encounter (HOSPITAL_COMMUNITY): Payer: Self-pay | Admitting: Internal Medicine

## 2023-07-10 ENCOUNTER — Encounter (HOSPITAL_COMMUNITY): Payer: Self-pay | Admitting: Internal Medicine

## 2023-07-11 NOTE — Telephone Encounter (Signed)
 She can take her Jardiance  at any time of day, whatever works best for her. The "before breakfast" part of the prescription is a holdover from when the Epic build was first done and Jardiance  was only approved for a diabetes indication. This is no longer required.

## 2023-07-26 ENCOUNTER — Encounter (HOSPITAL_COMMUNITY): Payer: Self-pay | Admitting: Internal Medicine

## 2023-07-28 ENCOUNTER — Encounter (HOSPITAL_COMMUNITY): Payer: Self-pay | Admitting: Internal Medicine

## 2023-07-28 NOTE — Addendum Note (Signed)
 Encounter addended by: Glorietta Lark, RN on: 07/28/2023 3:51 PM  Actions taken: Flowsheet accepted

## 2023-07-30 ENCOUNTER — Emergency Department (HOSPITAL_COMMUNITY)
Admission: EM | Admit: 2023-07-30 | Discharge: 2023-07-30 | Disposition: A | Discharge status: Home / Self Care | Attending: Emergency Medicine | Admitting: Emergency Medicine

## 2023-07-30 ENCOUNTER — Emergency Department (HOSPITAL_COMMUNITY)

## 2023-07-30 ENCOUNTER — Other Ambulatory Visit: Payer: Self-pay

## 2023-07-30 DIAGNOSIS — D72829 Elevated white blood cell count, unspecified: Secondary | ICD-10-CM | POA: Insufficient documentation

## 2023-07-30 DIAGNOSIS — N200 Calculus of kidney: Secondary | ICD-10-CM

## 2023-07-30 DIAGNOSIS — Z7901 Long term (current) use of anticoagulants: Secondary | ICD-10-CM | POA: Diagnosis not present

## 2023-07-30 DIAGNOSIS — N132 Hydronephrosis with renal and ureteral calculous obstruction: Secondary | ICD-10-CM | POA: Diagnosis not present

## 2023-07-30 DIAGNOSIS — R112 Nausea with vomiting, unspecified: Secondary | ICD-10-CM | POA: Diagnosis present

## 2023-07-30 LAB — CBC WITH DIFFERENTIAL/PLATELET
Abs Immature Granulocytes: 0.05 10*3/uL (ref 0.00–0.07)
Basophils Absolute: 0.1 10*3/uL (ref 0.0–0.1)
Basophils Relative: 0 %
Eosinophils Absolute: 0.1 10*3/uL (ref 0.0–0.5)
Eosinophils Relative: 1 %
HCT: 40.4 % (ref 36.0–46.0)
Hemoglobin: 13.3 g/dL (ref 12.0–15.0)
Immature Granulocytes: 0 %
Lymphocytes Relative: 14 %
Lymphs Abs: 1.8 10*3/uL (ref 0.7–4.0)
MCH: 31.7 pg (ref 26.0–34.0)
MCHC: 32.9 g/dL (ref 30.0–36.0)
MCV: 96.2 fL (ref 80.0–100.0)
Monocytes Absolute: 0.5 10*3/uL (ref 0.1–1.0)
Monocytes Relative: 4 %
Neutro Abs: 10.4 10*3/uL — ABNORMAL HIGH (ref 1.7–7.7)
Neutrophils Relative %: 81 %
Platelets: 174 10*3/uL (ref 150–400)
RBC: 4.2 MIL/uL (ref 3.87–5.11)
RDW: 13.7 % (ref 11.5–15.5)
WBC: 12.8 10*3/uL — ABNORMAL HIGH (ref 4.0–10.5)
nRBC: 0 % (ref 0.0–0.2)

## 2023-07-30 LAB — I-STAT CHEM 8, ED
BUN: 15 mg/dL (ref 8–23)
Calcium, Ion: 1.18 mmol/L (ref 1.15–1.40)
Chloride: 106 mmol/L (ref 98–111)
Creatinine, Ser: 1.2 mg/dL — ABNORMAL HIGH (ref 0.44–1.00)
Glucose, Bld: 128 mg/dL — ABNORMAL HIGH (ref 70–99)
HCT: 36 % (ref 36.0–46.0)
Hemoglobin: 12.2 g/dL (ref 12.0–15.0)
Potassium: 3.7 mmol/L (ref 3.5–5.1)
Sodium: 143 mmol/L (ref 135–145)
TCO2: 22 mmol/L (ref 22–32)

## 2023-07-30 LAB — COMPREHENSIVE METABOLIC PANEL WITH GFR
ALT: 16 U/L (ref 0–44)
AST: 28 U/L (ref 15–41)
Albumin: 4 g/dL (ref 3.5–5.0)
Alkaline Phosphatase: 59 U/L (ref 38–126)
Anion gap: 15 (ref 5–15)
BUN: 16 mg/dL (ref 8–23)
CO2: 22 mmol/L (ref 22–32)
Calcium: 9 mg/dL (ref 8.9–10.3)
Chloride: 107 mmol/L (ref 98–111)
Creatinine, Ser: 1.04 mg/dL — ABNORMAL HIGH (ref 0.44–1.00)
GFR, Estimated: 53 mL/min — ABNORMAL LOW (ref >60)
Glucose, Bld: 140 mg/dL — ABNORMAL HIGH (ref 70–99)
Potassium: 3 mmol/L — ABNORMAL LOW (ref 3.5–5.1)
Sodium: 144 mmol/L (ref 135–145)
Total Bilirubin: 0.8 mg/dL (ref 0.0–1.2)
Total Protein: 7.6 g/dL (ref 6.5–8.1)

## 2023-07-30 LAB — LIPASE, BLOOD: Lipase: 32 U/L (ref 11–51)

## 2023-07-30 LAB — URINALYSIS, ROUTINE W REFLEX MICROSCOPIC
Bacteria, UA: NONE SEEN
Bilirubin Urine: NEGATIVE
Glucose, UA: >=500 mg/dL — AB
Ketones, ur: 20 mg/dL — AB
Leukocytes,Ua: NEGATIVE
Nitrite: NEGATIVE
Protein, ur: 100 mg/dL — AB
RBC / HPF: >50 RBC/hpf (ref 0–5)
Specific Gravity, Urine: >1.046 — ABNORMAL HIGH (ref 1.005–1.030)
pH: 5 (ref 5.0–8.0)

## 2023-07-30 MED ORDER — SODIUM CHLORIDE 0.9 % IV SOLN
12.5000 mg | Freq: Four times a day (QID) | INTRAVENOUS | Status: DC | PRN
  Administered 2023-07-30: 12.5 mg via INTRAVENOUS
  Filled 2023-07-30: qty 0.5

## 2023-07-30 MED ORDER — TAMSULOSIN HCL 0.4 MG PO CAPS: 0.4000 mg | ORAL_CAPSULE | Freq: Every day | ORAL | 0 refills | Status: DC

## 2023-07-30 MED ORDER — IOHEXOL 300 MG/ML  SOLN
100.0000 mL | Freq: Once | INTRAMUSCULAR | Status: AC | PRN
  Administered 2023-07-30: 100 mL via INTRAVENOUS

## 2023-07-30 MED ORDER — KETOROLAC TROMETHAMINE 30 MG/ML IJ SOLN
30.0000 mg | Freq: Once | INTRAMUSCULAR | Status: AC
  Administered 2023-07-30: 30 mg via INTRAVENOUS
  Filled 2023-07-30: qty 1

## 2023-07-30 MED ORDER — ONDANSETRON HCL 4 MG/2ML IJ SOLN
4.0000 mg | Freq: Once | INTRAMUSCULAR | Status: AC
  Administered 2023-07-30: 4 mg via INTRAVENOUS
  Filled 2023-07-30: qty 2

## 2023-07-30 MED ORDER — PROMETHAZINE HCL 25 MG/ML IJ SOLN: 25.0000 mg | Freq: Once | INTRAMUSCULAR | 0 refills | Status: DC

## 2023-07-30 MED ORDER — HYDROMORPHONE HCL 1 MG/ML IJ SOLN: 0.5000 mg | Freq: Once | INTRAMUSCULAR | Status: DC

## 2023-07-30 MED ORDER — SODIUM CHLORIDE 0.9 % IV BOLUS
500.0000 mL | Freq: Once | INTRAVENOUS | Status: AC
Start: 2023-07-30 — End: 2023-07-30
  Administered 2023-07-30: 500 mL via INTRAVENOUS

## 2023-07-30 MED ORDER — ONDANSETRON 4 MG PO TBDP: ORAL_TABLET | ORAL | 0 refills | Status: AC

## 2023-07-30 MED ORDER — OXYCODONE-ACETAMINOPHEN 5-325 MG PO TABS: ORAL_TABLET | ORAL | 0 refills | Status: DC

## 2023-07-30 NOTE — ED Provider Notes (Signed)
 [place] EMERGENCY DEPARTMENT AT [facility] Provider Note   CSN: [id number] Arrival date & time: 07/30/23  1207     History  No chief complaint on file.   Michelle Ayala is a 83 y.o. female.  HPI   This patient is an 83 year old female, she has a history of atrial fibrillation on Eliquis , she has a history of a prior cesarean section and was in her usual state of health last night when she ate at a [employer], this morning she developed profuse nausea and vomiting starting about 3 hours prior to arrival.  She has not been able to tolerate any of her own medications or fluids, she denies any diarrhea today though she did have some diarrhea several days ago.  She does have mild abdominal discomfort with this, no fevers or chills, no coughing or shortness of breath  Home Medications Prior to Admission medications   Medication Sig Start Date End Date Taking? Authorizing Provider  acetaminophen  (TYLENOL ) 500 MG tablet Take 500 mg by mouth every 6 (six) hours as needed for mild pain.    [provider]  albuterol (VENTOLIN HFA) 108 (90 Base) MCG/ACT inhaler Inhale 2 puffs into the lungs every 6 (six) hours as needed for wheezing or shortness of breath.    [provider]  apixaban  (ELIQUIS ) 5 MG TABS tablet Take 1 tablet (5 mg total) by mouth 2 (two) times daily. 03/30/23   Laurann Pollock, MD  cholecalciferol  (VITAMIN D ) 25 MCG (1000 UNIT) tablet Take 1,000 Units by mouth daily. 03/30/20   [provider]  empagliflozin  (JARDIANCE ) 10 MG TABS tablet Take 1 tablet (10 mg total) by mouth daily before breakfast. 07/04/23   Bensimhon, Rheta Celestine, MD  ezetimibe  (ZETIA ) 10 MG tablet Take 10 mg by mouth daily.    [provider]  fluticasone (FLONASE) 50 MCG/ACT nasal spray Place 2 sprays into both nostrils daily as needed for allergies.    [provider]  furosemide  (LASIX ) 20 MG tablet Take 1 tablet (20 mg total) by mouth 2 (two) times  a week. On Monday & Friday. 07/06/23   Bensimhon, Daniel R, MD  guaiFENesin (MUCINEX) 600 MG 12 hr tablet Take 600 mg by mouth daily as needed for cough.    [provider]  meclizine  (ANTIVERT ) 25 MG tablet Take 1 tablet (25 mg total) by mouth 3 (three) times daily as needed for dizziness. 06/29/13   Latanya Poisson, MD  metoprolol  tartrate (LOPRESSOR ) 50 MG tablet Take 1 tablet (50 mg total) by mouth 2 (two) times daily. 03/30/23   Laurann Pollock, MD  ondansetron  (ZOFRAN ) 4 MG tablet Take 4 mg by mouth every 8 (eight) hours as needed for nausea or vomiting. 12/31/19   [provider]      Allergies    Cinnamon, Shellfish allergy, Poison ivy extract, and Poison oak extract    Review of Systems   Review of Systems  All other systems reviewed and are negative.   Physical Exam Updated Vital Signs There were no vitals taken for this visit. Physical Exam Vitals and nursing note reviewed.  Constitutional:      General: She is not in acute distress.    Appearance: She is well-developed.  HENT:     Head: Normocephalic and atraumatic.     Mouth/Throat:     Pharynx: No oropharyngeal exudate.  Eyes:     General: No scleral icterus.       Right eye: No  discharge.        Left eye: No discharge.     Conjunctiva/sclera: Conjunctivae normal.     Pupils: Pupils are equal, round, and reactive to light.  Neck:     Thyroid : No thyromegaly.     Vascular: No JVD.  Cardiovascular:     Rate and Rhythm: Normal rate and regular rhythm.     Heart sounds: Normal heart sounds. No murmur heard.    No friction rub. No gallop.  Pulmonary:     Effort: Pulmonary effort is normal. No respiratory distress.     Breath sounds: Normal breath sounds. No wheezing or rales.  Abdominal:     General: Bowel sounds are normal. There is no distension.     Palpations: Abdomen is soft. There is no mass.     Tenderness: There is no abdominal tenderness.  Musculoskeletal:        General: No tenderness.  Normal range of motion.     Cervical back: Normal range of motion and neck supple.  Lymphadenopathy:     Cervical: No cervical adenopathy.  Skin:    General: Skin is warm and dry.     Findings: No erythema or rash.  Neurological:     Mental Status: She is alert.     Coordination: Coordination normal.  Psychiatric:        Behavior: Behavior normal.     ED Results / Procedures / Treatments   Labs (all labs ordered are listed, but only abnormal results are displayed) Labs Reviewed  COMPREHENSIVE METABOLIC PANEL WITH GFR  LIPASE, BLOOD  CBC WITH DIFFERENTIAL/PLATELET  URINALYSIS, ROUTINE W REFLEX MICROSCOPIC    EKG None  Radiology No results found.  Procedures Procedures    Medications Ordered in ED Medications  ondansetron  (ZOFRAN ) injection 4 mg (has no administration in time range)  sodium chloride  0.9 % bolus 500 mL (has no administration in time range)    ED Course/ Medical Decision Making/ A&P                                 Medical Decision Making Risk Prescription drug management.   Despite the patient feeling poorly with nausea and vomiting she has fairly unremarkable vital signs and essentially nontender abdomen.  I am concerned about a possible obstruction as she has not had a stool in 3 days or passing gas, there may be other causes as well could could be food related, she states that she does have a shellfish allergy and was around it last night but did not have any symptoms last night.  Labs, fluids, CT scan imaging.  Patient agreeable  Labs show leukocytosis of 12,800, no anemia, normal lipase, renal function is preserved electrolytes show mild hypokalemia  Imaging: I personally viewed and interpreted the CT scan which to me shows that there does appear to be some hydronephrosis on the left, appears to have calcified fibroids or something abnormal in the pelvis, possibly obstructing kidney stone, patient informed of these findings but awaiting  radiologist interpretation at the time of change of shift.  Consultations: At the time of change of shift care signed out to Dr. Birda Buffy to follow-up results and disposition accordingly.  ED course: Patient given antiemetics, IV fluids, Zofran , Phenergan, Toradol and a small amount of Dilaudid with some improvement.  Urinalysis has not yet been obtained.          Final Clinical Impression(s) / ED  Diagnoses Final diagnoses:  None    Rx / DC Orders ED Discharge Orders     None         Early Glisson, MD 07/30/23 [phone]

## 2023-07-30 NOTE — ED Provider Notes (Signed)
 CT scan shows 2 mm kidney stone at the distal left ureter.  Patient improved with Toradol.  She is sent home with Flomax, Zofran  and Percocets.  She has seen Dr. Derrick Fling of urology before and she will follow-up with urology   Cheyenne Cotta, MD 07/30/23 1659

## 2023-07-30 NOTE — Discharge Instructions (Signed)
 Follow-up with Dr. Derrick Fling or one of his colleagues in the next 1 to 2 weeks

## 2023-07-30 NOTE — ED Triage Notes (Signed)
 Pt arrived via [facility] from home  c/o N/V that started today at 9am. Denies diarrhea

## 2023-07-31 ENCOUNTER — Telehealth (HOSPITAL_COMMUNITY): Payer: Self-pay | Admitting: Emergency Medicine

## 2023-07-31 ENCOUNTER — Telehealth: Payer: Self-pay | Admitting: Home Health

## 2023-07-31 MED ORDER — TAMSULOSIN HCL 0.4 MG PO CAPS: 0.4000 mg | ORAL_CAPSULE | Freq: Every day | ORAL | 0 refills | Status: DC

## 2023-07-31 MED ORDER — TAMSULOSIN HCL 0.4 MG PO CAPS: 0.4000 mg | ORAL_CAPSULE | Freq: Every day | ORAL | 0 refills | Status: AC

## 2023-07-31 NOTE — Telephone Encounter (Signed)
 Patient called with questing the Flomax be sent to [facility] in [place].  This has been e-prescribed.

## 2023-07-31 NOTE — Telephone Encounter (Signed)
 Patient's daughter called after hour line, states patient was seen at ER yesterday, was given Flomax, she is concerned that patient is also on jardiance  and lasix , worry this combo is too much for the patient. Explained each medication and its indication. Informed the daughter it's OK to take all three medication. Advised daughter to monitor BP for the patient as Flomax is an alpha blocker and watch for hypotension. She also asked if Flomax can be sent to a different pharmacy. Advised the daughter to call ER as I did not see the patient yesterday.

## 2023-08-01 NOTE — Telephone Encounter (Signed)
 Duplicate encounter Addressed in other encounter Ok to proceed  Device registered

## 2023-08-04 ENCOUNTER — Encounter (HOSPITAL_COMMUNITY): Admitting: Internal Medicine

## 2023-08-08 ENCOUNTER — Telehealth (HOSPITAL_COMMUNITY): Payer: Self-pay

## 2023-08-08 NOTE — Telephone Encounter (Signed)
 [name] made aware and appt scheduled

## 2023-08-08 NOTE — Telephone Encounter (Signed)
 Patients daughter Burdette Carolin called stating that patient was recently in the hospital for kidney stones and was told by Dr. Bensimhon to hold Jardiance  and Lasix . She wants to know when she should resume those 2 medications. Please advise.     [phone] or [phone]

## 2023-08-10 ENCOUNTER — Encounter (HOSPITAL_COMMUNITY): Payer: Self-pay

## 2023-08-10 DIAGNOSIS — I5032 Chronic diastolic (congestive) heart failure: Secondary | ICD-10-CM

## 2023-08-14 ENCOUNTER — Other Ambulatory Visit (HOSPITAL_COMMUNITY): Payer: Self-pay | Admitting: Cardiology

## 2023-08-14 DIAGNOSIS — I5032 Chronic diastolic (congestive) heart failure: Secondary | ICD-10-CM

## 2023-08-15 ENCOUNTER — Encounter (HOSPITAL_COMMUNITY)

## 2023-08-30 ENCOUNTER — Encounter (HOSPITAL_COMMUNITY): Admitting: Internal Medicine

## 2023-08-30 ENCOUNTER — Other Ambulatory Visit (HOSPITAL_COMMUNITY)
Admission: RE | Admit: 2023-08-30 | Discharge: 2023-08-30 | Disposition: A | Discharge status: Home / Self Care | Source: Ambulatory Visit | Attending: Internal Medicine | Admitting: Internal Medicine

## 2023-08-30 DIAGNOSIS — I5032 Chronic diastolic (congestive) heart failure: Secondary | ICD-10-CM | POA: Diagnosis present

## 2023-08-30 LAB — BASIC METABOLIC PANEL WITH GFR
Anion gap: 14 (ref 5–15)
BUN: 12 mg/dL (ref 8–23)
CO2: 23 mmol/L (ref 22–32)
Calcium: 9.1 mg/dL (ref 8.9–10.3)
Chloride: 104 mmol/L (ref 98–111)
Creatinine, Ser: 0.97 mg/dL (ref 0.44–1.00)
GFR, Estimated: 58 mL/min — ABNORMAL LOW (ref >60)
Glucose, Bld: 111 mg/dL — ABNORMAL HIGH (ref 70–99)
Potassium: 3.5 mmol/L (ref 3.5–5.1)
Sodium: 141 mmol/L (ref 135–145)

## 2023-08-30 LAB — URINALYSIS, ROUTINE W REFLEX MICROSCOPIC
Bilirubin Urine: NEGATIVE
Glucose, UA: NEGATIVE mg/dL
Ketones, ur: NEGATIVE mg/dL
Leukocytes,Ua: NEGATIVE
Nitrite: NEGATIVE
Protein, ur: 30 mg/dL — AB
Specific Gravity, Urine: 1.025 (ref 1.005–1.030)
pH: 5 (ref 5.0–8.0)

## 2023-09-02 ENCOUNTER — Encounter (INDEPENDENT_AMBULATORY_CARE_PROVIDER_SITE_OTHER): Payer: Self-pay | Admitting: Cardiology

## 2023-09-02 DIAGNOSIS — G4733 Obstructive sleep apnea (adult) (pediatric): Secondary | ICD-10-CM | POA: Diagnosis not present

## 2023-09-14 ENCOUNTER — Other Ambulatory Visit: Payer: Self-pay | Admitting: Cardiology

## 2023-09-17 NOTE — Procedures (Signed)
    SLEEP STUDY REPORT Patient Information Study Date: 09/02/2023 Patient Name: Michelle Ayala Patient ID: [id number] Birth Date: 10/25/1940 Age: 83 Gender: Female BMI: 34.0 (W=192 lb, H=5' 3'') Stopbang: 4 Referring Physician: Dan Bensimhon, MD  TEST DESCRIPTION: Home sleep apnea testing was completed using the WatchPat, a Type 1 device, utilizing peripheral arterial tonometry (PAT), chest movement, actigraphy, pulse oximetry, pulse rate, body position and snore. AHI was calculated with apnea and hypopnea using valid sleep time as the denominator. RDI includes apneas, hypopneas, and RERAs. The data acquired and the scoring of sleep and all associated events were performed in accordance with the recommended standards and specifications as outlined in the AASM Manual for the Scoring of Sleep and Associated Events 2.2.0 (2015).  FINDINGS:  1. Severe Obstructive Sleep Apnea with AHI 32.1/hr.  2. No significant Central Sleep Apnea with pAHIc 7.7/hr.  3. Oxygen desaturations as low as 85%.  4. Moderate to severe snoring was present. O2 sats were < 88% for 0.2 min.  5. Total sleep time was 7 hrs and 21 min.  6. 15.5% of total sleep time was spent in REM sleep.  7. Normal sleep onset latency at 18 min.  8. Normal REM sleep onset latency at 96 min.  9. Total awakenings were 14. 10. Arrhythmia detection: Suggestive of possible brief atrial fibrillation lasting 7 hrs 51 min and 10seconds. This is not diagnostic and further testing with outpatient telemetry monitoring is recommended.  DIAGNOSIS: Severe Obstructive Sleep Apnea (G47.33) Possible Atrial Fibrillation  RECOMMENDATIONS: 1. Clinical correlation of these findings is necessary. The decision to treat obstructive sleep apnea (OSA) is usually based on the presence of apnea symptoms or the presence of associated medical conditions such as Hypertension, Congestive Heart Failure, Atrial Fibrillation or Obesity. The most common symptoms of  OSA are snoring, gasping for breath while sleeping, daytime sleepiness and fatigue. 2. Initiating apnea therapy is recommended given the presence of symptoms and/or associated conditions. Recommend proceeding with one of the following:  a. Auto-CPAP therapy with a pressure range of 5-20cm H2O.  b. An oral appliance (OA) that can be obtained from certain dentists with expertise in sleep medicine. These are primarily of use in non-obese patients with mild and moderate disease.  c. An ENT consultation which may be useful to look for specific causes of obstruction and possible treatment options.  d. If patient is intolerant to PAP therapy, consider referral to ENT for evaluation for hypoglossal nerve stimulator. 3. Close follow-up is necessary to ensure success with CPAP or oral appliance therapy for maximum benefit . 4. A follow-up oximetry study on CPAP is recommended to assess the adequacy of therapy and determine the need for supplemental oxygen or the potential need for Bi-level therapy. An arterial blood gas to determine the adequacy of baseline ventilation and oxygenation should also be considered. 5. Healthy sleep recommendations include: adequate nightly sleep (normal 7-9 hrs/night), avoidance of caffeine after noon and alcohol near bedtime, and maintaining a sleep environment that is cool, dark and quiet. 6. Weight loss for overweight patients is recommended. Even modest amounts of weight loss can significantly improve the severity of sleep apnea. 7. Snoring recommendations include: weight loss where appropriate, side sleeping, and avoidance of alcohol before bed. 8. Operation of motor vehicle should be avoided when sleepy.  Signature: Wilbert Bihari, MD; [facility]; Diplomat, American Board of Sleep Medicine Electronically Signed: 09/17/2023 2:42:35 PM

## 2023-09-18 ENCOUNTER — Ambulatory Visit: Attending: Internal Medicine

## 2023-09-18 DIAGNOSIS — I5032 Chronic diastolic (congestive) heart failure: Secondary | ICD-10-CM

## 2023-09-18 DIAGNOSIS — I272 Pulmonary hypertension, unspecified: Secondary | ICD-10-CM

## 2023-09-18 DIAGNOSIS — G4719 Other hypersomnia: Secondary | ICD-10-CM

## 2023-09-18 DIAGNOSIS — I4819 Other persistent atrial fibrillation: Secondary | ICD-10-CM

## 2023-09-20 ENCOUNTER — Telehealth: Payer: Self-pay | Admitting: *Deleted

## 2023-09-20 DIAGNOSIS — G4733 Obstructive sleep apnea (adult) (pediatric): Secondary | ICD-10-CM

## 2023-09-20 DIAGNOSIS — G4719 Other hypersomnia: Secondary | ICD-10-CM

## 2023-09-20 DIAGNOSIS — I5032 Chronic diastolic (congestive) heart failure: Secondary | ICD-10-CM

## 2023-09-20 DIAGNOSIS — I4819 Other persistent atrial fibrillation: Secondary | ICD-10-CM

## 2023-09-20 NOTE — Telephone Encounter (Signed)
 The patient has been notified of the result and verbalized understanding.  All questions (if any) were answered. Joshua Dalton Seip, CMA 09/20/2023 4:52 PM    Will precert titration

## 2023-09-20 NOTE — Telephone Encounter (Signed)
-----   Message from Wilbert Bihari sent at 09/17/2023  2:44 PM EDT ----- Please let patient know that they have sleep apnea.  Recommend therapeutic CPAP titration for treatment of patient's sleep disordered breathing.

## 2023-09-24 ENCOUNTER — Ambulatory Visit (HOSPITAL_COMMUNITY): Payer: Self-pay | Admitting: Internal Medicine

## 2023-10-14 ENCOUNTER — Other Ambulatory Visit: Payer: Self-pay | Admitting: Cardiology

## 2023-10-16 NOTE — Telephone Encounter (Signed)
 Prescription refill request for Eliquis  received. Indication:afib Last office visit:3/25 Scr:0.97  6/25 Age: 83 Weight:87.2  kg  Prescription refilled

## 2023-10-17 ENCOUNTER — Encounter (HOSPITAL_COMMUNITY): Payer: Self-pay | Admitting: Internal Medicine

## 2023-10-17 ENCOUNTER — Ambulatory Visit (HOSPITAL_COMMUNITY)
Admission: RE | Admit: 2023-10-17 | Discharge: 2023-10-17 | Disposition: A | Discharge status: Home / Self Care | Source: Ambulatory Visit | Attending: Internal Medicine | Admitting: Internal Medicine

## 2023-10-17 VITALS — BP 130/70 | HR 78 | Ht 63.0 in | Wt 188.6 lb

## 2023-10-17 DIAGNOSIS — I5022 Chronic systolic (congestive) heart failure: Secondary | ICD-10-CM | POA: Insufficient documentation

## 2023-10-17 DIAGNOSIS — I272 Pulmonary hypertension, unspecified: Secondary | ICD-10-CM

## 2023-10-17 DIAGNOSIS — I5032 Chronic diastolic (congestive) heart failure: Secondary | ICD-10-CM

## 2023-10-17 DIAGNOSIS — G4733 Obstructive sleep apnea (adult) (pediatric): Secondary | ICD-10-CM | POA: Insufficient documentation

## 2023-10-17 DIAGNOSIS — I2721 Secondary pulmonary arterial hypertension: Secondary | ICD-10-CM

## 2023-10-17 DIAGNOSIS — Z87442 Personal history of urinary calculi: Secondary | ICD-10-CM | POA: Diagnosis not present

## 2023-10-17 DIAGNOSIS — I4821 Permanent atrial fibrillation: Secondary | ICD-10-CM | POA: Insufficient documentation

## 2023-10-17 DIAGNOSIS — Z7901 Long term (current) use of anticoagulants: Secondary | ICD-10-CM | POA: Insufficient documentation

## 2023-10-17 DIAGNOSIS — I110 Hypertensive heart disease with heart failure: Secondary | ICD-10-CM | POA: Diagnosis not present

## 2023-10-17 DIAGNOSIS — E669 Obesity, unspecified: Secondary | ICD-10-CM | POA: Insufficient documentation

## 2023-10-17 DIAGNOSIS — I4891 Unspecified atrial fibrillation: Secondary | ICD-10-CM

## 2023-10-17 DIAGNOSIS — I4892 Unspecified atrial flutter: Secondary | ICD-10-CM | POA: Diagnosis present

## 2023-10-17 NOTE — Addendum Note (Signed)
 Encounter addended by: Buell Powell HERO, RN on: 10/17/2023 2:55 PM  Actions taken: Clinical Note Signed

## 2023-10-17 NOTE — Progress Notes (Signed)
 ADVANCED [facility] CLINIC NOTE  Referring Physician: The [facility] Primary Care: [facility] Primary Cardiologist: Alvan Carrier, MD  Chief Complaint: PAH  HPI:  Michelle Ayala is a 83 y.o. female with chronic AF/AFL, HTN, obesity and PAH referred by Dr. Alvan for [facility]  Had TEE/DCCV in 4/12 for AF. Remained out for about 10 days and recurred. Seen in AF clinic. Discussed amiodarone but family decided against it.    Echo 10/24 EF 60-65 RV mildly reduced RVSP 68. Mild MR. Severe LAE.   RHC 2/25 RA 6 PA45/21 (31) PCPW 17 (v=22)  PVR 4.2 Fick 3.5/1.8   Here with her daughter. Says she is doing pretty well. Able to do ADLs without too much problem. Says she has occasional wheezing. Will get some swelling in her ankles when she eats to much salt or sits too long. No syncope or presyncope. She stopped jardiance  because she didn't like the way she felt. Also got a kidney stone  HST 7/25: severe OSA AHI 32    Past Medical History:  Diagnosis Date   Anxiety    Atrial fibrillation (HCC)    Heart disease    High cholesterol    Vertigo     Current Outpatient Medications  Medication Sig Dispense Refill   albuterol (VENTOLIN HFA) 108 (90 Base) MCG/ACT inhaler Inhale 2 puffs into the lungs every 6 (six) hours as needed for wheezing or shortness of breath.     cholecalciferol  (VITAMIN D ) 25 MCG (1000 UNIT) tablet Take 1,000 Units by mouth daily.     ELIQUIS  5 MG TABS tablet TAKE 1 TABLET BY MOUTH TWICE A DAY 180 tablet 1   ezetimibe  (ZETIA ) 10 MG tablet Take 10 mg by mouth daily.     fluticasone (FLONASE) 50 MCG/ACT nasal spray Place 2 sprays into both nostrils daily as needed for allergies.     furosemide  (LASIX ) 20 MG tablet TAKE 1 TABLET BY MOUTH AS DIRECTED ON MONDAY, WEDNESDAY AND FRIDAY 36 tablet 2   guaiFENesin (MUCINEX) 600 MG 12 hr tablet Take 600 mg by mouth daily as needed for cough.     meclizine  (ANTIVERT ) 25 MG tablet Take 1  tablet (25 mg total) by mouth 3 (three) times daily as needed for dizziness. 20 tablet 0   metoprolol  tartrate (LOPRESSOR ) 50 MG tablet TAKE 1 TABLET BY MOUTH TWICE A DAY 180 tablet 1   ondansetron  (ZOFRAN -ODT) 4 MG disintegrating tablet 4mg  ODT q4 hours prn nausea/vomit (Patient taking differently: 4 mg every 8 (eight) hours as needed for vomiting or nausea. 4mg  ODT q4 hours prn nausea/vomit) 10 tablet 0   tamsulosin  (FLOMAX ) 0.4 MG CAPS capsule Take 1 capsule (0.4 mg total) by mouth daily after supper. 10 capsule 0   empagliflozin  (JARDIANCE ) 10 MG TABS tablet Take 1 tablet (10 mg total) by mouth daily before breakfast. 30 tablet 6   oxyCODONE -acetaminophen  (PERCOCET/ROXICET) 5-325 MG tablet Take 1 every 6 hours for pain that is not improved by Tylenol  alone 20 tablet 0   No current facility-administered medications for this encounter.    Allergies  Allergen Reactions   Cinnamon Anaphylaxis   Shellfish Allergy Anaphylaxis   Poison Ivy Extract     Rash    Poison Oak Extract     Rash       Social History   Socioeconomic History   Marital status: Single    Spouse name: Not on file   Number of children: Not  on file   Years of education: Not on file   Highest education level: Not on file  Occupational History   Not on file  Tobacco Use   Smoking status: Never   Smokeless tobacco: Never  Substance and Sexual Activity   Alcohol use: Not Currently    Comment: occ   Drug use: No   Sexual activity: Not on file  Other Topics Concern   Not on file  Social History Narrative   Not on file   Social Drivers of Health   Financial Resource Strain: Not on file  Food Insecurity: Not on file  Transportation Needs: Not on file  Physical Activity: Not on file  Stress: Not on file  Social Connections: Not on file  Intimate Partner Violence: Not on file      Family History  Problem Relation Age of Onset   Hypertension Mother    Heart attack Mother 10   Heart attack Father 62    Heart attack Brother 90   Hyperlipidemia Brother    Cancer Brother        throat and tongue cancer, base of skull   Other Other        per, Luke Mura, daughter and patient-mother or father did not have a history of cancer   Breast cancer Daughter    Urolithiasis Neg Hx    Lupus Neg Hx    Sudden death Neg Hx    Sickle cell trait Neg Hx    Prostate cancer Neg Hx    Clotting disorder Neg Hx     Vitals:   10/17/23 1433  BP: 130/70  Pulse: 78  SpO2: 98%  Weight: 85.5 kg (188 lb 9.6 oz)  Height: 5' 3 (1.6 m)   Body mass index is 33.41 kg/m.  Wt Readings from Last 3 Encounters:  10/17/23 85.5 kg (188 lb 9.6 oz)  07/04/23 87.2 kg (192 lb 3.2 oz)  05/11/23 87.7 kg (193 lb 6.4 oz)     PHYSICAL EXAM: General:  Elderly Well appearing. No respiratory difficulty HEENT: normal Neck: supple. no JVD. Carotids 2+ bilat; no bruits. No lymphadenopathy or thryomegaly appreciated. Cor: PMI nondisplaced. irregular rate & rhythm. No rubs, gallops or murmurs. Lungs: clear Abdomen: soft, nontender, nondistended. No hepatosplenomegaly. No bruits or masses. Good bowel sounds. Extremities: no cyanosis, clubbing, rash, edema Neuro: alert & orientedx3, cranial nerves grossly intact. moves all 4 extremities w/o difficulty. Affect pleasant   ASSESSMENT & PLAN:  1. 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 Mild MR. Severe LAE.  - RHC 2/25 RA 6 PA 45/21 (31) PCPW 17 (v=22)  PVR 4.2 Fick 3.5/1.8  - HST 7/25: severe OSA AHI 32 -> will refer to Dr. Shlomo - Mainstay of therapy will be to manage volume status and BP closely. Weight loss will also help. No role for selected pulmonary artery vasodilators - Failed jardiance  - Taking lasix  20mg  MWF - Suspect major driver of PAH is OSA. Will refer to Dr. Enrico fo CPAP  2. Chronic diastolic HF - volume ok. Can take extra lasix  as needed - failed  Jardiance    3. Permanent AF - chronic - Continue Eliquis . No bleeding  4. Obesity  - consider GLP1RA  5. OSA - HST 7/25: severe OSA AHI 32 -> will refer to Dr. Shlomo Toribio Fuel, MD  2:44 PM

## 2023-10-17 NOTE — Patient Instructions (Signed)
 Great to see you today!!!  Continue current medications  Your physician recommends that you schedule a follow-up appointment in: 1 year (August 2026), **PLEASE CALL OUR OFFICE IN JUNE TO SCHEDULE THIS APPOINTMENT  If you have any questions or concerns before your next appointment please send us  a message through [place] or call our office at [phone].    TO LEAVE A MESSAGE FOR THE NURSE SELECT OPTION 2, PLEASE LEAVE A MESSAGE INCLUDING: YOUR NAME DATE OF BIRTH CALL BACK NUMBER REASON FOR CALL**this is important as we prioritize the call backs  YOU WILL RECEIVE A CALL BACK THE SAME DAY AS LONG AS YOU CALL BEFORE 4:00 PM  At the [facility], you and your health needs are our priority. As part of our continuing mission to provide you with exceptional heart care, we have created designated Provider Care Teams. These Care Teams include your primary Cardiologist (physician) and Advanced Practice Providers (APPs- Physician Assistants and Nurse Practitioners) who all work together to provide you with the care you need, when you need it.   You may see any of the following providers on your designated Care Team at your next follow up: Dr Toribio Fuel Dr Ezra Shuck Dr. Ria Commander Dr. Morene Brownie Amy Lenetta, NP Caffie Shed, [place] [facility] Alanreed, [place] Beckey Coe, NP [place] Lee, NP Ellouise Class, NP Tinnie Redman, PharmD Jaun Bash, PharmD   Please be sure to bring in all your medications bottles to every appointment.    Thank you for choosing [facility]

## 2023-10-24 ENCOUNTER — Encounter: Payer: Self-pay | Admitting: Cardiology

## 2023-10-24 ENCOUNTER — Ambulatory Visit: Attending: Cardiology | Admitting: Cardiology

## 2023-10-24 VITALS — BP 125/78 | HR 72 | Ht 63.0 in | Wt 190.0 lb

## 2023-10-24 DIAGNOSIS — I4891 Unspecified atrial fibrillation: Secondary | ICD-10-CM | POA: Diagnosis not present

## 2023-10-24 DIAGNOSIS — D6869 Other thrombophilia: Secondary | ICD-10-CM | POA: Diagnosis not present

## 2023-10-24 DIAGNOSIS — E782 Mixed hyperlipidemia: Secondary | ICD-10-CM

## 2023-10-24 DIAGNOSIS — I272 Pulmonary hypertension, unspecified: Secondary | ICD-10-CM | POA: Diagnosis not present

## 2023-10-24 NOTE — Patient Instructions (Signed)
 Medication Instructions:  Continue all current medications.   Labwork: none  Testing/Procedures: none  Follow-Up: 6 months   Any Other Special Instructions Will Be Listed Below (If Applicable).   If you need a refill on your cardiac medications before your next appointment, please call your pharmacy.

## 2023-10-24 NOTE — Progress Notes (Signed)
 Clinical Summary Ms. Santelli is a 83 y.o.female seen today for follow upof the following medical problems.      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    - referred to HF clinic - from notes suspect primarily group II pulm HTN, perhaps a group III component given OSA - no recommended role for pulmonary vasodilators - stopped jardiance  as she reported she did not feel well on it, curently taking lasix  20mg  MWF - no SOB/DOE sedentary, swelling comes and goes.       2. Afib/atypical aflutter - followed in afib clinic   - s/p TEE/DCCV 06/17/19 - low HR's after conversion, metoprolol  was stopped - recurrent arrhythmia at 06/28/19 visit. Restarted on lopressor  50mg  bid, discussed amio at afib clinic but patient wanted to wait on starting      - no recent palpitations - no bleeding on eliquis       3. Hyperlipidemia - labs followed by pcp - she is on vytorin  - side effects on crestor, lipitor, simvastatin .    07/2020 TC 108 TC 144 HDL 44 LDL 80 04/2022 TC 185 TG 155 HDL 56 LDL 104  - on pravastatin 40mg , zetia  10mg  daily. - nonspecific muscle aches unclear if related to statin       4.Severe OSA - recently referred to Dr Shlomo   Past Medical History:  Diagnosis Date   Anxiety    Atrial fibrillation (HCC)    Heart disease    High cholesterol    Vertigo      Allergies  Allergen Reactions   Cinnamon Anaphylaxis   Shellfish Allergy Anaphylaxis   Poison Ivy Extract     Rash    Poison Oak Extract     Rash      Current Outpatient Medications  Medication Sig Dispense Refill   albuterol (VENTOLIN  HFA) 108 (90 Base) MCG/ACT inhaler Inhale 2 puffs into the lungs every 6 (six) hours as needed for wheezing or shortness of breath.     cholecalciferol  (VITAMIN D ) 25 MCG (1000 UNIT) tablet Take 1,000 Units by mouth daily.     ELIQUIS  5 MG TABS tablet TAKE 1 TABLET BY MOUTH TWICE A DAY 180 tablet 1   empagliflozin  (JARDIANCE ) 10 MG TABS tablet Take 1 tablet (10 mg total) by mouth daily before breakfast. 30 tablet 6   ezetimibe  (ZETIA ) 10 MG tablet Take 10 mg by mouth daily.     fluticasone (FLONASE) 50 MCG/ACT nasal spray Place 2 sprays into both nostrils daily as needed for allergies.     furosemide  (LASIX ) 20 MG tablet TAKE 1 TABLET BY MOUTH AS DIRECTED ON MONDAY, WEDNESDAY AND FRIDAY 36 tablet 2   guaiFENesin (MUCINEX) 600 MG 12 hr tablet Take 600 mg by mouth daily as needed for cough.     meclizine  (ANTIVERT ) 25 MG tablet Take 1 tablet (25 mg total) by mouth 3 (three) times daily as needed for dizziness. 20 tablet 0   metoprolol  tartrate (LOPRESSOR ) 50 MG tablet TAKE 1 TABLET BY MOUTH TWICE A DAY 180 tablet 1   ondansetron  (ZOFRAN -ODT) 4 MG disintegrating tablet 4mg   ODT q4 hours prn nausea/vomit (Patient taking differently: 4 mg every 8 (eight) hours as needed for vomiting or nausea. 4mg  ODT q4 hours prn nausea/vomit) 10 tablet 0   oxyCODONE -acetaminophen  (PERCOCET/ROXICET) 5-325 MG tablet Take 1 every 6 hours for pain that is not improved by Tylenol  alone 20 tablet 0   tamsulosin  (FLOMAX ) 0.4 MG CAPS capsule Take 1 capsule (0.4 mg total) by mouth daily after supper. 10 capsule 0   No current facility-administered medications for this visit.     Past Surgical History:  Procedure Laterality Date   BREAST SURGERY     cyst removal   BUBBLE STUDY  06/24/2019   Procedure: BUBBLE STUDY;  Surgeon: Loni Soyla LABOR, MD;  Location: [facility] ENDOSCOPY;  Service: Cardiovascular;;   CARDIOVERSION N/A 06/24/2019   Procedure: CARDIOVERSION;  Surgeon: Loni Soyla LABOR, MD;  Location: [facility] ENDOSCOPY;   Service: Cardiovascular;  Laterality: N/A;   CESAREAN SECTION     RIGHT HEART CATH N/A 04/14/2023   Procedure: RIGHT HEART CATH;  Surgeon: Wendel Lurena POUR, MD;  Location: [facility] INVASIVE CV LAB;  Service: Cardiovascular;  Laterality: N/A;   TEE WITHOUT CARDIOVERSION N/A 06/24/2019   Procedure: TRANSESOPHAGEAL ECHOCARDIOGRAM (TEE);  Surgeon: Loni Soyla LABOR, MD;  Location: [facility] ENDOSCOPY;  Service: Cardiovascular;  Laterality: N/A;   TUBAL LIGATION       Allergies  Allergen Reactions   Cinnamon Anaphylaxis   Shellfish Allergy Anaphylaxis   Poison Ivy Extract     Rash    Poison Oak Extract     Rash       Family History  Problem Relation Age of Onset   Hypertension Mother    Heart attack Mother 1   Heart attack Father 13   Heart attack Brother 53   Hyperlipidemia Brother    Cancer Brother        throat and tongue cancer, base of skull   Other Other        per, Luke Mura, daughter and patient-mother or father did not have a history of cancer   Breast cancer Daughter    Urolithiasis Neg Hx    Lupus Neg Hx    Sudden death Neg Hx    Sickle cell trait Neg Hx    Prostate cancer Neg Hx    Clotting disorder Neg Hx      Social History Ms. Siegenthaler reports that she has never smoked. She has never used smokeless tobacco. Ms. Elem reports that she does not currently use alcohol.    Physical Examination Today's Vitals   10/24/23 1428  BP: 125/78  Pulse: 72  Weight: 190 lb (86.2 kg)  Height: 5' 3 (1.6 m)   Body mass index is 33.66 kg/m.  Gen: resting comfortably, no acute distress HEENT: no scleral icterus, pupils equal round and reactive, no palptable cervical adenopathy,  CV: irreg, no mrg, no jvd Resp: Clear to auscultation bilaterally GI: abdomen is soft, non-tender, non-distended, normal bowel sounds, no hepatosplenomegaly MSK: extremities are warm, no edema.  Skin: warm, no rash Neuro:  no focal deficits Psych: appropriate affect   Diagnostic Studies TEE  06/24/19 1. Left ventricular ejection fraction, by estimation, is 55 to 60%. The  left ventricle has normal function. The left ventricle has no regional  wall motion abnormalities.   2. Right ventricular systolic function is mildly reduced. The right  ventricular size is moderately enlarged.   3. Left atrial size was mild to moderately dilated. No left atrial/left  atrial appendage thrombus was detected.  The LAA emptying velocity was 42 cm/s.   4. Right atrial size was mildly dilated.   5. Small pericardial effusion. The pericardial effusion is  circumferential. There is no evidence of cardiac tamponade.   6. The mitral valve is degenerative. Mild to moderate mitral valve  regurgitation.   7. Tricuspid valve regurgitation is mild to moderate.   8. The aortic valve is normal in structure. Aortic valve regurgitation is  trivial. No aortic stenosis is present.   9. There is mild (Grade II) atheroma plaque involving the transverse and descending aorta.  10. Evidence of atrial level shunting detected by color flow Doppler.  Agitated saline contrast bubble study was positive with shunting observed within 3-6 cardiac cycles suggestive of interatrial shunt. 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   Conclusion(s)/Recommendation(s): No LA/LAA thrombus identified. Successful  cardioversion performed with restoration of normal sinus rhythm.    12/2022 echo 1. Left ventricular ejection fraction, by estimation, is 60 to 65%. The  left ventricle has normal function. The left ventricle has no regional  wall motion abnormalities. Left ventricular diastolic parameters are  indeterminate.   2. Right ventricular systolic function is mildly reduced. The right  ventricular size is mildly enlarged. There is severely elevated pulmonary  artery systolic pressure. The estimated right ventricular systolic  pressure is 68.2 mmHg.   3. Left atrial size was mild to  moderately dilated.   4. The mitral valve is normal in structure. Mild mitral valve  regurgitation. No evidence of mitral stenosis.   5. The tricuspid valve is abnormal. Tricuspid valve regurgitation is  moderate.   6. The aortic valve is tricuspid. Aortic valve regurgitation is trivial.  No aortic stenosis is present.   7. The inferior vena cava is dilated in size with >50% respiratory  variability, suggesting right atrial pressure of 8 mmHg.       Assessment and Plan   1. Pulmonary HTN - continue to follow with HF clinic - continue oral diuretic, appears euvolemic and doing well symptoms wise   2. Afib/aflutter/acquired thrombophilia -no recent symptoms - continue current meds including eliquis  for stroke prevention  3. HLD - nonspecific muscle aches, unclear if related to statin. For now continue therapy. She is not in favor of an injectable      Krisha Beegle F. Emmanuelle Hibbitts, M.D.

## 2023-10-25 ENCOUNTER — Encounter (HOSPITAL_COMMUNITY): Payer: Self-pay | Admitting: Internal Medicine

## 2023-10-26 NOTE — Telephone Encounter (Signed)
Patient's daughter is calling to follow up. Please advise.

## 2023-10-26 NOTE — Telephone Encounter (Signed)
 After pt had itamar sleep study, Dr. Shlomo suggested CPAP titration study that not been completed and there is not auth on file for it. Does she need to have that done first before scheduling?

## 2023-10-26 NOTE — Addendum Note (Signed)
 Encounter addended by: Joshua Dalton MATSU, CMA on: 10/26/2023 2:54 PM  Actions taken: Flowsheet accepted

## 2023-10-26 NOTE — Telephone Encounter (Signed)
 Prior Authorization for TITRATION sent to [facility] via web portal. Tracking Number Approved Authorization [id number]  Tracking [id number]

## 2023-10-30 ENCOUNTER — Other Ambulatory Visit (HOSPITAL_COMMUNITY)
Admission: RE | Admit: 2023-10-30 | Discharge: 2023-10-30 | Disposition: A | Discharge status: Home / Self Care | Source: Ambulatory Visit | Attending: Family Medicine | Admitting: Family Medicine

## 2023-10-30 DIAGNOSIS — E782 Mixed hyperlipidemia: Secondary | ICD-10-CM | POA: Insufficient documentation

## 2023-10-30 LAB — BASIC METABOLIC PANEL WITH GFR
Anion gap: 11 (ref 5–15)
BUN: 10 mg/dL (ref 8–23)
CO2: 25 mmol/L (ref 22–32)
Calcium: 9.1 mg/dL (ref 8.9–10.3)
Chloride: 104 mmol/L (ref 98–111)
Creatinine, Ser: 0.84 mg/dL (ref 0.44–1.00)
GFR, Estimated: >60 mL/min (ref >60)
Glucose, Bld: 110 mg/dL — ABNORMAL HIGH (ref 70–99)
Potassium: 3.8 mmol/L (ref 3.5–5.1)
Sodium: 140 mmol/L (ref 135–145)

## 2023-10-30 LAB — LIPID PANEL
Cholesterol: 163 mg/dL (ref 0–200)
HDL: 39 mg/dL — ABNORMAL LOW (ref >40)
LDL Cholesterol: 101 mg/dL — ABNORMAL HIGH (ref 0–99)
Total CHOL/HDL Ratio: 4.2 ratio
Triglycerides: 114 mg/dL (ref <150)
VLDL: 23 mg/dL (ref 0–40)

## 2023-10-31 NOTE — Telephone Encounter (Signed)
 Reached out to patient and she states her daughter has decided not get an appointment to see Dr Shlomo but go with her mother to her sleep study appointment.

## 2023-12-13 ENCOUNTER — Ambulatory Visit (HOSPITAL_BASED_OUTPATIENT_CLINIC_OR_DEPARTMENT_OTHER): Attending: Cardiology | Admitting: Cardiology

## 2023-12-13 DIAGNOSIS — I493 Ventricular premature depolarization: Secondary | ICD-10-CM | POA: Insufficient documentation

## 2023-12-13 DIAGNOSIS — I4819 Other persistent atrial fibrillation: Secondary | ICD-10-CM | POA: Insufficient documentation

## 2023-12-13 DIAGNOSIS — G4733 Obstructive sleep apnea (adult) (pediatric): Secondary | ICD-10-CM | POA: Insufficient documentation

## 2023-12-13 DIAGNOSIS — I5032 Chronic diastolic (congestive) heart failure: Secondary | ICD-10-CM | POA: Insufficient documentation

## 2023-12-13 DIAGNOSIS — G4719 Other hypersomnia: Secondary | ICD-10-CM | POA: Insufficient documentation

## 2023-12-29 ENCOUNTER — Telehealth: Payer: Self-pay | Admitting: Cardiology

## 2023-12-29 NOTE — Telephone Encounter (Signed)
 Daughter would like a call back to review 10/08 sleep study results.

## 2024-01-02 NOTE — Procedures (Signed)
  Indications for Polysomnography The patient is a 83 year-old Female who is 5' 3 and weighs 190.0 lbs. Her BMI equals 33.7.  A full night titration treatment study was performed.  Patient reported taking her medications at 8:00 pm.MetoprololEliquis Polysomnogram Data A full night polysomnogram recorded the standard physiologic parameters including EEG, EOG, EMG, EKG, nasal and oral airflow.  Respiratory parameters of chest and abdominal movements were recorded with Respiratory Inductance Plethysmography belts.   Oxygen saturation was recorded by pulse oximetry.  Sleep Architecture The total recording time of the polysomnogram was 451.2 minutes.  The total sleep time was 221.0 minutes.  The patient spent 7.7% of total sleep time in Stage N1, 79.2% in Stage N2, 1.6% in Stages N3, and 11.5% in REM.  Sleep latency was 63.6 minutes.   REM latency was 48.0 minutes.  Sleep Efficiency was 49.0%.  Wake after Sleep Onset time was 166.5 minutes.  Titration Summary The patient was titrated at pressures ranging from 6 cm/H20 up to 19 cm/H20.  The last pressure used in the study was 19 cm/H20.  Respiratory Events The polysomnogram revealed a presence of 0 obstructive, 1 central, and 0 mixed apneas resulting in an Apnea index of 0.3 events per hour.  There were 61 hypopneas (GreaterEqual to3% desaturation and/or arousal) resulting in an Apnea\Hypopnea Index (AHI  GreaterEqual to3% desaturation and/or arousal) of 16.8 events per hour.  There were 25 hypopneas (GreaterEqual to4% desaturation) resulting in an Apnea\Hypopnea Index (AHI GreaterEqual to4% desaturation) of 7.1 events per hour.  There were 48 Respiratory  Effort Related Arousals resulting in a RERA index of 13.0 events per hour. The Respiratory Disturbance Index is 29.9 events per hour.  The snore index was 0 events per hour.  Mean oxygen saturation was 95.8%.  The lowest oxygen saturation during sleep was 91.0%.  Time spent LessEqual to88% oxygen  saturation was 0 minutes .  Limb Activity There were 0 limb movements recorded.  Cardiac Summary The average pulse rate was 69.6 bpm.  The minimum pulse rate was 46.0 bpm while the maximum pulse rate was 100.0 bpm.  Cardiac rhythm was atrial fibrillation with PVCs  Diagnosis: Obstructive sleep apnea Atrial fibrillation with PVCs Successful CPAP titration     Recommendations: 1. Recommended trial of ResMed CPAP at 19 cm H2O, EPR of 2 cm H2O with heated humidity and large Fisher and Paykel Evora  fullface mask. 2. Close follow-up is necessary to ensure success with CPAP or oral appliance therapy for maximum benefit. 3. A follow-up oximetry study on CPAP is recommended to assess the adequacy of therapy and determine the need for supplemental oxygen or the potential need for Bi-level therapy.  An arterial blood gas to determine the adequacy of baseline ventilation and  oxygenation should also be considered. 4. Healthy sleep recommendations include:  adequate nightly sleep (normal 7-9 hrs/night), avoidance of caffeine after noon and alcohol near bedtime, and maintaining a sleep environment that is cool, dark and quiet. 5. Weight loss for overweight patients is recommended.  Even modest amounts of weight loss can significantly improve the severity of sleep apnea. 6. Snoring recommendations include:  weight loss where appropriate, side sleeping, and avoidance of alcohol before bed. 7. Operation of motor vehicle should be avoided when sleepy.    This study was personally reviewed and electronically signed by: Dr. Wilbert Bihari Accredited Board Certified in Sleep Medicine Date/Time: 01/02/2024 2:26 PM

## 2024-01-02 NOTE — Telephone Encounter (Signed)
 Pt daughter called in asking for titration results. Informed her I did not see results yet but nurse will call once they are.

## 2024-01-03 ENCOUNTER — Telehealth: Payer: Self-pay | Admitting: *Deleted

## 2024-01-03 DIAGNOSIS — G4719 Other hypersomnia: Secondary | ICD-10-CM

## 2024-01-03 DIAGNOSIS — I4819 Other persistent atrial fibrillation: Secondary | ICD-10-CM

## 2024-01-03 DIAGNOSIS — G4733 Obstructive sleep apnea (adult) (pediatric): Secondary | ICD-10-CM

## 2024-01-03 DIAGNOSIS — I272 Pulmonary hypertension, unspecified: Secondary | ICD-10-CM

## 2024-01-03 NOTE — Telephone Encounter (Signed)
 The patient has been notified of the result and verbalized understanding.  All questions (if any) were answered. Michelle Ayala, CMA 01/03/2024 4:04 PM     Upon patient request DME selection is [facility] [place], [place]. Patient understands he will be contacted by [facility] [place] to set up his cpap. Patient understands to call if [facility] does not contact him with new setup in a timely manner. Patient understands they will be called once confirmation has been received from [facility] that they have received their new machine to schedule 10 week follow up appointment. [facility] notified of new cpap order  Please add to airview Patient was grateful for the call and thanked me

## 2024-01-03 NOTE — Telephone Encounter (Signed)
-----   Message from Wilbert Bihari sent at 01/02/2024  2:27 PM EDT ----- Please let patient know that they had a successful PAP titration and let DME know that orders are in EPIC.  Please set up 6 week OV with me.

## 2024-01-10 NOTE — Addendum Note (Signed)
 Addended by: JOSHUA DALTON MATSU on: 01/10/2024 11:49 AM   Modules accepted: Orders

## 2024-01-10 NOTE — Telephone Encounter (Signed)
 Patients daughter called in asking for a Fisher and Paykel Simplus Medium FF mask to receive at her cpap setup. Patient says it fits better.

## 2024-01-12 NOTE — Telephone Encounter (Signed)
Order placed to [facility]

## 2024-01-12 NOTE — Telephone Encounter (Signed)
 The patient has been notified of the result and verbalized understanding.  All questions (if any) were answered. Joshua Dalton Seip, CMA 01/12/2024 1:18 PM

## 2024-01-17 NOTE — Addendum Note (Signed)
 Addended by: JOSHUA DALTON MATSU on: 01/17/2024 04:32 PM   Modules accepted: Orders

## 2024-01-17 NOTE — Telephone Encounter (Signed)
 Patient daughter called into ask for a nasal mask or a nasal pillow mask because her mother can't tolerate her current mask.

## 2024-01-18 NOTE — Telephone Encounter (Signed)
 Order placed to [facility].

## 2024-01-18 NOTE — Addendum Note (Signed)
 Addended by: JOSHUA DALTON MATSU on: 01/18/2024 03:10 PM   Modules accepted: Orders

## 2024-02-19 ENCOUNTER — Other Ambulatory Visit (HOSPITAL_COMMUNITY)
Admission: RE | Admit: 2024-02-19 | Discharge: 2024-02-19 | Disposition: A | Discharge status: Home / Self Care | Source: Ambulatory Visit | Attending: Family Medicine | Admitting: Family Medicine

## 2024-02-19 DIAGNOSIS — E559 Vitamin D deficiency, unspecified: Secondary | ICD-10-CM | POA: Diagnosis present

## 2024-02-19 DIAGNOSIS — I10 Essential (primary) hypertension: Secondary | ICD-10-CM | POA: Insufficient documentation

## 2024-02-19 DIAGNOSIS — E782 Mixed hyperlipidemia: Secondary | ICD-10-CM | POA: Diagnosis present

## 2024-02-19 DIAGNOSIS — R7303 Prediabetes: Secondary | ICD-10-CM | POA: Diagnosis present

## 2024-02-19 LAB — BASIC METABOLIC PANEL WITH GFR
Anion gap: 9 (ref 5–15)
BUN: 12 mg/dL (ref 8–23)
CO2: 29 mmol/L (ref 22–32)
Calcium: 9.5 mg/dL (ref 8.9–10.3)
Chloride: 103 mmol/L (ref 98–111)
Creatinine, Ser: 0.94 mg/dL (ref 0.44–1.00)
GFR, Estimated: 59 mL/min — ABNORMAL LOW (ref >60)
Glucose, Bld: 105 mg/dL — ABNORMAL HIGH (ref 70–99)
Potassium: 3.7 mmol/L (ref 3.5–5.1)
Sodium: 141 mmol/L (ref 135–145)

## 2024-02-19 LAB — LIPID PANEL
Cholesterol: 194 mg/dL (ref 0–200)
HDL: 45 mg/dL (ref >40)
LDL Cholesterol: 121 mg/dL — ABNORMAL HIGH (ref 0–99)
Total CHOL/HDL Ratio: 4.3 ratio
Triglycerides: 139 mg/dL (ref <150)
VLDL: 28 mg/dL (ref 0–40)

## 2024-02-19 LAB — HEMOGLOBIN A1C
Hgb A1c MFr Bld: 5.4 % (ref 4.8–5.6)
Mean Plasma Glucose: 108.28 mg/dL

## 2024-02-19 LAB — VITAMIN D 25 HYDROXY (VIT D DEFICIENCY, FRACTURES): Vit D, 25-Hydroxy: 39.56 ng/mL (ref 30–100)

## 2024-02-20 LAB — MICROALBUMIN / CREATININE URINE RATIO
Creatinine, Urine: 270.9 mg/dL
Microalb Creat Ratio: 32 mg/g{creat} — ABNORMAL HIGH (ref 0–29)
Microalb, Ur: 85.5 ug/mL — ABNORMAL HIGH

## 2024-06-12 ENCOUNTER — Ambulatory Visit: Admitting: Cardiology
